# Patient Record
Sex: Female | Born: 2000 | Race: Asian | Hispanic: No | Smoking: Never smoker
Health system: Southern US, Community
[De-identification: ages and names within clinical notes are randomized; demographics above are authoritative.]

## PROBLEM LIST (undated history)

## (undated) ENCOUNTER — Inpatient Hospital Stay: Payer: Self-pay

## (undated) DIAGNOSIS — J45909 Unspecified asthma, uncomplicated: Secondary | ICD-10-CM

## (undated) DIAGNOSIS — T7422XA Child sexual abuse, confirmed, initial encounter: Secondary | ICD-10-CM

## (undated) DIAGNOSIS — M419 Scoliosis, unspecified: Secondary | ICD-10-CM

## (undated) DIAGNOSIS — Z8619 Personal history of other infectious and parasitic diseases: Secondary | ICD-10-CM

## (undated) DIAGNOSIS — F339 Major depressive disorder, recurrent, unspecified: Secondary | ICD-10-CM

## (undated) DIAGNOSIS — F4310 Post-traumatic stress disorder, unspecified: Secondary | ICD-10-CM

## (undated) HISTORY — PX: OTHER SURGICAL HISTORY: SHX169

## (undated) HISTORY — DX: Unspecified asthma, uncomplicated: J45.909

## (undated) HISTORY — DX: Child sexual abuse, confirmed, initial encounter: T74.22XA

## (undated) HISTORY — DX: Personal history of other infectious and parasitic diseases: Z86.19

## (undated) HISTORY — DX: Scoliosis, unspecified: M41.9

## (undated) HISTORY — PX: NO PAST SURGERIES: SHX2092

## (undated) HISTORY — DX: Post-traumatic stress disorder, unspecified: F43.10

## (undated) HISTORY — DX: Major depressive disorder, recurrent, unspecified: F33.9

---
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
## 2020-01-11 NOTE — Progress Notes (Deleted)
Pt present f/u for anxiety and depression. PHQ-9=    GAD-7=

## 2020-01-12 ENCOUNTER — Encounter: Payer: Medicaid Other | Admitting: Obstetrics and Gynecology

## 2020-03-01 ENCOUNTER — Encounter: Payer: Medicaid Other | Admitting: Obstetrics and Gynecology

## 2020-05-18 ENCOUNTER — Encounter: Payer: Medicaid Other | Admitting: Obstetrics and Gynecology

## 2020-08-08 ENCOUNTER — Ambulatory Visit: Payer: Self-pay

## 2020-12-28 DIAGNOSIS — Z202 Contact with and (suspected) exposure to infections with a predominantly sexual mode of transmission: Secondary | ICD-10-CM | POA: Diagnosis not present

## 2020-12-28 DIAGNOSIS — Z3202 Encounter for pregnancy test, result negative: Secondary | ICD-10-CM | POA: Diagnosis not present

## 2020-12-28 DIAGNOSIS — N898 Other specified noninflammatory disorders of vagina: Secondary | ICD-10-CM | POA: Diagnosis not present

## 2021-03-13 DIAGNOSIS — N898 Other specified noninflammatory disorders of vagina: Secondary | ICD-10-CM | POA: Diagnosis not present

## 2021-03-13 DIAGNOSIS — Z113 Encounter for screening for infections with a predominantly sexual mode of transmission: Secondary | ICD-10-CM | POA: Diagnosis not present

## 2021-04-21 DIAGNOSIS — O209 Hemorrhage in early pregnancy, unspecified: Secondary | ICD-10-CM | POA: Diagnosis not present

## 2021-04-21 DIAGNOSIS — O039 Complete or unspecified spontaneous abortion without complication: Secondary | ICD-10-CM | POA: Diagnosis not present

## 2021-04-21 DIAGNOSIS — Z3A00 Weeks of gestation of pregnancy not specified: Secondary | ICD-10-CM | POA: Diagnosis not present

## 2021-04-21 DIAGNOSIS — N898 Other specified noninflammatory disorders of vagina: Secondary | ICD-10-CM | POA: Diagnosis not present

## 2021-04-21 DIAGNOSIS — R102 Pelvic and perineal pain: Secondary | ICD-10-CM | POA: Diagnosis not present

## 2021-04-24 ENCOUNTER — Telehealth: Payer: Self-pay

## 2021-04-24 NOTE — Telephone Encounter (Signed)
Transition Care Management Follow-up Telephone Call  Date of discharge and from where: 04/21/2021 from [facility]  How have you been since you were released from the hospital? Pt stated that she is feeling well.   Any questions or concerns? No  Items Reviewed:  Did the pt receive and understand the discharge instructions provided? Yes   Medications obtained and verified? Yes   Other? No   Any new allergies since your discharge? No   Dietary orders reviewed? n/a  Do you have support at home? Yes   Functional Questionnaire: (I = Independent and D = Dependent) ADLs: I  Bathing/Dressing- I  Meal Prep- I  Eating- I  Maintaining continence- I  Transferring/Ambulation- I  Managing Meds- I   Follow up appointments reviewed:   PCP Hospital f/u appt confirmed? No    Specialist Hospital f/u appt confirmed? No    Are transportation arrangements needed? No   If their condition worsens, is the pt aware to call PCP or go to the Emergency Dept.? Yes  Was the patient provided with contact information for the PCP's office or ED? Yes  Was to pt encouraged to call back with questions or concerns? Yes

## 2021-05-11 DIAGNOSIS — Z87898 Personal history of other specified conditions: Secondary | ICD-10-CM | POA: Diagnosis not present

## 2021-05-11 DIAGNOSIS — O039 Complete or unspecified spontaneous abortion without complication: Secondary | ICD-10-CM | POA: Diagnosis not present

## 2021-05-11 DIAGNOSIS — N926 Irregular menstruation, unspecified: Secondary | ICD-10-CM | POA: Diagnosis not present

## 2021-05-11 DIAGNOSIS — F331 Major depressive disorder, recurrent, moderate: Secondary | ICD-10-CM | POA: Diagnosis not present

## 2021-06-14 DIAGNOSIS — N760 Acute vaginitis: Secondary | ICD-10-CM | POA: Diagnosis not present

## 2021-06-14 DIAGNOSIS — Z113 Encounter for screening for infections with a predominantly sexual mode of transmission: Secondary | ICD-10-CM | POA: Diagnosis not present

## 2021-09-05 DIAGNOSIS — Z3202 Encounter for pregnancy test, result negative: Secondary | ICD-10-CM | POA: Diagnosis not present

## 2021-09-05 DIAGNOSIS — Z113 Encounter for screening for infections with a predominantly sexual mode of transmission: Secondary | ICD-10-CM | POA: Diagnosis not present

## 2021-09-05 DIAGNOSIS — R109 Unspecified abdominal pain: Secondary | ICD-10-CM | POA: Diagnosis not present

## 2021-10-27 DIAGNOSIS — Z113 Encounter for screening for infections with a predominantly sexual mode of transmission: Secondary | ICD-10-CM | POA: Diagnosis not present

## 2021-10-27 DIAGNOSIS — N760 Acute vaginitis: Secondary | ICD-10-CM | POA: Diagnosis not present

## 2022-01-11 DIAGNOSIS — N910 Primary amenorrhea: Secondary | ICD-10-CM | POA: Diagnosis not present

## 2022-01-11 DIAGNOSIS — N912 Amenorrhea, unspecified: Secondary | ICD-10-CM | POA: Diagnosis not present

## 2022-05-23 DIAGNOSIS — R3915 Urgency of urination: Secondary | ICD-10-CM | POA: Diagnosis not present

## 2022-05-23 DIAGNOSIS — N390 Urinary tract infection, site not specified: Secondary | ICD-10-CM | POA: Diagnosis not present

## 2022-08-15 DIAGNOSIS — Z113 Encounter for screening for infections with a predominantly sexual mode of transmission: Secondary | ICD-10-CM | POA: Diagnosis not present

## 2022-10-01 ENCOUNTER — Encounter: Payer: Self-pay | Admitting: Obstetrics and Gynecology

## 2022-10-26 DIAGNOSIS — Z3201 Encounter for pregnancy test, result positive: Secondary | ICD-10-CM | POA: Diagnosis not present

## 2022-10-26 DIAGNOSIS — N912 Amenorrhea, unspecified: Secondary | ICD-10-CM | POA: Diagnosis not present

## 2022-10-28 DIAGNOSIS — J069 Acute upper respiratory infection, unspecified: Secondary | ICD-10-CM | POA: Diagnosis not present

## 2022-10-28 DIAGNOSIS — R070 Pain in throat: Secondary | ICD-10-CM | POA: Diagnosis not present

## 2022-10-28 DIAGNOSIS — Z20822 Contact with and (suspected) exposure to covid-19: Secondary | ICD-10-CM | POA: Diagnosis not present

## 2022-10-31 DIAGNOSIS — O99210 Obesity complicating pregnancy, unspecified trimester: Secondary | ICD-10-CM | POA: Diagnosis present

## 2022-10-31 DIAGNOSIS — Z8759 Personal history of other complications of pregnancy, childbirth and the puerperium: Secondary | ICD-10-CM | POA: Insufficient documentation

## 2022-11-22 DIAGNOSIS — Z8659 Personal history of other mental and behavioral disorders: Secondary | ICD-10-CM | POA: Diagnosis not present

## 2022-11-22 DIAGNOSIS — O99210 Obesity complicating pregnancy, unspecified trimester: Secondary | ICD-10-CM | POA: Diagnosis not present

## 2022-11-22 DIAGNOSIS — O0991 Supervision of high risk pregnancy, unspecified, first trimester: Secondary | ICD-10-CM | POA: Diagnosis not present

## 2022-11-22 DIAGNOSIS — Z113 Encounter for screening for infections with a predominantly sexual mode of transmission: Secondary | ICD-10-CM | POA: Diagnosis not present

## 2022-11-22 DIAGNOSIS — Z124 Encounter for screening for malignant neoplasm of cervix: Secondary | ICD-10-CM | POA: Diagnosis not present

## 2022-11-22 DIAGNOSIS — O99211 Obesity complicating pregnancy, first trimester: Secondary | ICD-10-CM | POA: Diagnosis not present

## 2022-11-22 DIAGNOSIS — Z8759 Personal history of other complications of pregnancy, childbirth and the puerperium: Secondary | ICD-10-CM | POA: Diagnosis not present

## 2022-11-22 DIAGNOSIS — F32A Depression, unspecified: Secondary | ICD-10-CM | POA: Diagnosis not present

## 2022-11-22 DIAGNOSIS — O99340 Other mental disorders complicating pregnancy, unspecified trimester: Secondary | ICD-10-CM | POA: Diagnosis not present

## 2022-11-22 DIAGNOSIS — O26891 Other specified pregnancy related conditions, first trimester: Secondary | ICD-10-CM | POA: Diagnosis not present

## 2022-11-22 DIAGNOSIS — O219 Vomiting of pregnancy, unspecified: Secondary | ICD-10-CM | POA: Diagnosis not present

## 2022-11-22 DIAGNOSIS — O99891 Other specified diseases and conditions complicating pregnancy: Secondary | ICD-10-CM | POA: Diagnosis not present

## 2022-11-22 DIAGNOSIS — N898 Other specified noninflammatory disorders of vagina: Secondary | ICD-10-CM | POA: Diagnosis not present

## 2022-11-22 LAB — OB RESULTS CONSOLE RUBELLA ANTIBODY, IGM: Rubella: IMMUNE

## 2022-11-22 LAB — OB RESULTS CONSOLE HEPATITIS B SURFACE ANTIGEN: Hepatitis B Surface Ag: NEGATIVE

## 2022-11-22 LAB — OB RESULTS CONSOLE GC/CHLAMYDIA
Chlamydia: NEGATIVE
Neisseria Gonorrhea: NEGATIVE

## 2022-11-22 LAB — OB RESULTS CONSOLE VARICELLA ZOSTER ANTIBODY, IGG: Varicella: IMMUNE

## 2022-11-28 ENCOUNTER — Telehealth: Payer: Self-pay | Admitting: Licensed Clinical Social Worker

## 2022-11-28 DIAGNOSIS — Z331 Pregnant state, incidental: Secondary | ICD-10-CM | POA: Diagnosis not present

## 2022-11-28 DIAGNOSIS — J209 Acute bronchitis, unspecified: Secondary | ICD-10-CM | POA: Diagnosis not present

## 2022-11-28 DIAGNOSIS — J069 Acute upper respiratory infection, unspecified: Secondary | ICD-10-CM | POA: Diagnosis not present

## 2022-11-28 NOTE — Telephone Encounter (Signed)
-----   Message from Ellison Carwin sent at 11/27/2022  9:11 AM EST ----- Regarding: MH referral Morning Ma'am. Hope all is well.  May I refer this member to you on behalf of Cassandra?  She stated that she worked with you before with her 21 year old son and is open to receiving counseling services with you again.  She did report currently taking Lexapro, in which her doctor prescribed her about 1 week ago.  Thanks

## 2022-12-21 DIAGNOSIS — O99210 Obesity complicating pregnancy, unspecified trimester: Secondary | ICD-10-CM | POA: Diagnosis not present

## 2022-12-24 NOTE — L&D Delivery Note (Signed)
Delivery Note  DARNISE MONTAG is a [id number] at [redacted]w[redacted]d, Patient's last menstrual period was 08/22/2022 (exact date)., inconsistent with [place] at [redacted]w[redacted]d. Estimated Date of Delivery: 06/18/23   First Stage: Labor onset: 0200 Induction: misoprostol Analgesia Eliezer Lofts intrapartum: Epidural SROM at 0405 GBS: PCN IP Antibiotics: Vancomycin x 1 dose - stopped d/t potential reaction  Second Stage: Complete dilation at 0715 Onset of pushing at 0753 FHR second stage 130 bpm with moderate, variable decels with pushing   Khari presented to L&D with elevated blood pressures. Misoprostol was used for cervical ripening. She started to labor after SROM. She progressed well to C/C/-1 with a strong urge to push.  She pushed effectively over approximately 50 minutes for a spontaneous vaginal birth.  Delivery of a viable baby boy on 05/31/2023 at 0841 by CNM Delivery of fetal head in OA position with restitution to ROT. No nuchal cord;  Anterior then posterior shoulders delivered easily with gentle downward traction. Baby placed on mom's chest, and attended to by baby RN Cord double clamped after cessation of pulsation, cut by father of baby  Cord blood sample collection: Yes O POS  Third Stage: Oxytocin bolus started after delivery of infant for hemorrhage prophylaxis  Placenta delivered via Tomasa Blase mechanism intact with 3 VC @ 0846 Placenta disposition: discarded Uterine tone firm / bleeding minimal  No laceration identified  Anesthesia for repair: N/A Repair not indicated Est. Blood Loss (mL): 50 ml  Complications: none  Mom to postpartum.  Baby to Couplet care / Skin to Skin.  Newborn: Information for the patient's newborn:  Ericha, Whittingham [[id number]]  Live born female  Birth Weight: 6 lb 13 oz (3090 g) APGAR: 6, 9  Newborn Delivery   Birth date/time: 05/31/2023 08:41:00 Delivery type: Vaginal, Spontaneous     Feeding planned: breast feeding and formula  feeding  ---------- Margaretmary Eddy, CNM Certified Nurse Midwife [facility]

## 2022-12-25 ENCOUNTER — Telehealth: Payer: Self-pay | Admitting: Licensed Clinical Social Worker

## 2022-12-25 NOTE — Telephone Encounter (Signed)
-----   Message from Dorthy Cooler, RN sent at 12/20/2022  9:40 AM EST ----- Good Morning Estill Bamberg. I hope you had a merry Christmas. I wanted to let you know that I talked to [facility] today and she wanted me to let you know that she did come to sign consents however no one was at the information booth and no one was around. She states that she will return on her next day off.   Cassandra

## 2023-02-21 ENCOUNTER — Telehealth: Payer: Self-pay | Admitting: Licensed Clinical Social Worker

## 2023-02-21 NOTE — Telephone Encounter (Signed)
-----   Message from Dorthy Cooler, RN sent at 02/21/2023 10:57 AM EST ----- Kandis Nab would you reach back out to this patient? Per her EPIC visit note with provider she stated she has not been successful in contacting you and she is wanting counseling

## 2023-02-25 ENCOUNTER — Ambulatory Visit: Payer: Medicaid Other | Admitting: Licensed Clinical Social Worker

## 2023-02-25 DIAGNOSIS — F411 Generalized anxiety disorder: Secondary | ICD-10-CM | POA: Insufficient documentation

## 2023-02-25 DIAGNOSIS — F331 Major depressive disorder, recurrent, moderate: Secondary | ICD-10-CM
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
## 2023-03-11 ENCOUNTER — Ambulatory Visit: Payer: Medicaid Other | Admitting: Licensed Clinical Social Worker

## 2023-03-11 DIAGNOSIS — F411 Generalized anxiety disorder: Secondary | ICD-10-CM

## 2023-03-11 DIAGNOSIS — F331 Major depressive disorder, recurrent, moderate: Secondary | ICD-10-CM

## 2023-03-11 NOTE — Progress Notes (Signed)
Counselor/Therapist Progress Note  Patient ID: PALMIRA HIRSCHI, MRN: [id number],    Date: 03/11/2023  Time Spent: 52 minutes    Treatment Type: Psychotherapy  Reported Symptoms: Obsessive thinking and anxiety, anxious thoughts, overwhelm, irritability, sometimes sadness   Mental Status Exam:  Appearance:   Casual and Well Groomed     Behavior:  Appropriate, Sharing, and Motivated  Motor:  Normal  Speech/Language:   Clear and Coherent and Normal Rate  Affect:  Appropriate, Congruent, and Full Range  Mood:  normal  Thought process:  normal  Thought content:    WNL  Sensory/Perceptual disturbances:    WNL  Orientation:  oriented to person, place, time/date, situation, and day of week  Attention:  Good  Concentration:  Good  Memory:  WNL  Fund of knowledge:   Good  Insight:    Fair  Judgment:   Fair  Impulse Control:  Fair   Risk Assessment: Danger to Self:  No Self-injurious Behavior: No Danger to Others: No Duty to Warn:no Physical Aggression / Violence:No  Access to Firearms a concern: No  Gang Involvement:No   Subjective: Patient was engaged and cooperative throughout the session using time effectively to discuss thoughts, feelings, goal of treatment and treatment plan. Patient voices continued motivation for treatment and understanding of CBTs. Patient is likely to benefit from future treatment in combination of medication management.    Interventions: Cognitive Behavioral Therapy and client centered  Checked in with patient and reviewed previous session, including assessment and goal of treatment. Taught patient about CBTs. Explored patient's goal of treatment to increase understanding of patient's goals and  worked collaboratively to develop CBTs treatment plan. Provided support through active listening, validation of feelings, and highlighted patient's strengths.  Diagnosis:   ICD-10-CM   1. Major depressive disorder, recurrent episode, moderate (HCC)  F33.1      2. Generalized anxiety disorder  F41.1      Plan: Patient's goal of treatment is to hopefully to come out and have a different outlook and not be in a hamster ball and would like to look at things with the glass half-full versus half-empty and to also learn coping skills that work. Patient voices she would also like to do some trauma work / healing   Treatment Target: Understand the relationship between thoughts, emotions, and behaviors  Psychoeducation on CBTs model   Oriented the client to the therapeutic approach Teach the connection between thoughts, emotions, and behaviors   Treatment Target: Increase realistic balanced thinking -to learn how to replace thinking with thoughts that are more accurate or helpful Explore patient's thoughts, beliefs, automatic thoughts, assumptions  Identify and replace unhelpful thinking patterns (upsetting ideas, self-talk and mental images) Process trauma and distress and allow for emotional release and identification of "stuck points'  Questioning and challenging thoughts Cognitive reappraisal  Restructuring, Socratic questioning  Teach patient about core beliefs   Treatment Target: Increase sense of self - "who am I"  / Creating a life worth living  Values clarification  Self-care - nutrition, sleep, exercise  Mindfulness skills Emotional regulation skills  Interpersonal Effectiveness  Distress Tolerance Assist patient in developing goal and value directions   Future Appointments  Date Time Provider [place]  03/25/2023  9:00 AM Milton Ferguson, LCSW [facility] None     Milton Ferguson, LCSW

## 2023-03-25 ENCOUNTER — Ambulatory Visit: Payer: Medicaid Other | Admitting: Licensed Clinical Social Worker

## 2023-03-25 DIAGNOSIS — F411 Generalized anxiety disorder: Secondary | ICD-10-CM

## 2023-03-25 DIAGNOSIS — F331 Major depressive disorder, recurrent, moderate: Secondary | ICD-10-CM

## 2023-03-25 NOTE — Progress Notes (Signed)
Counselor/Therapist Progress Note  Patient ID: Ruth Robinson, MRN: [id number],    Date: 03/25/2023  Time Spent: 48 minutes    Treatment Type: Psychotherapy  Reported Symptoms:  "overall pretty good"  Mental Status Exam:  Appearance:   Neat and Well Groomed     Behavior:  Appropriate, Sharing, and Motivated  Motor:  Normal  Speech/Language:   Clear and Coherent and Normal Rate  Affect:  Appropriate, Congruent, and Full Range  Mood:  normal  Thought process:  normal  Thought content:    WNL  Sensory/Perceptual disturbances:    WNL  Orientation:  oriented to person, place, time/date, situation, and day of week  Attention:  Good  Concentration:  Good  Memory:  WNL  Fund of knowledge:   Good  Insight:    Fair  Judgment:   Fair  Impulse Control:  Fair   Risk Assessment: Danger to Self:  No Self-injurious Behavior: No Danger to Others: No Duty to Warn:no Physical Aggression / Violence:No  Access to Firearms a concern: No  Gang Involvement:No   Subjective: Patient was engaged and cooperative throughout the session using time effectively to discuss thoughts and feelings. Patient voices overall mood being good. Patient voices continued motivation for treatment and understanding of depression and anxiety related to environmental stressors. Patient is likely to benefit from future treatment because she remains motivated to decrease symptoms and improve functioning and reports benefit from sessions.   Interventions: Cognitive Behavioral Therapy and client centered  Established psychological safety. Checked in with patient regarding their week. LCSW processed with the patient how they have been doing since the last follow-up session. LCSW assisted patient in processing their thoughts and emotions regarding stress with family/living situation and unresolved trauma from childhood. LCSW taught patient about radical acceptance, discussed daily affirmations. Provided support through  active listening, validation of feelings, and highlighted patient's strengths.  Diagnosis:   ICD-10-CM   1. Major depressive disorder, recurrent episode, moderate  F33.1     2. Generalized anxiety disorder  F41.1      Plan: Patient's goal of treatment is to hopefully to come out and have a different outlook and not be in a hamster ball and would like to look at things with the glass half-full versus half-empty and to also learn coping skills that work. Patient voices she would also like to do some trauma work / healing    Treatment Target: Understand the relationship between thoughts, emotions, and behaviors  Psychoeducation on CBTs model   Oriented the client to the therapeutic approach Teach the connection between thoughts, emotions, and behaviors    Treatment Target: Increase realistic balanced thinking -to learn how to replace thinking with thoughts that are more accurate or helpful Explore patient's thoughts, beliefs, automatic thoughts, assumptions  Identify and replace unhelpful thinking patterns (upsetting ideas, self-talk and mental images) Process trauma and distress and allow for emotional release and identification of "stuck points'  Questioning and challenging thoughts Cognitive reappraisal  Restructuring, Socratic questioning  Teach patient about core beliefs    Treatment Target: Increase sense of self - "who am I"  / Creating a life worth living  Values clarification  Self-care - nutrition, sleep, exercise  Mindfulness skills Emotional regulation skills  Interpersonal Effectiveness  Distress Tolerance Assist patient in developing goal and value directions   Future Appointments  Date Time Provider [place]  04/01/2023  9:00 AM Milton Ferguson, LCSW [facility] None     Milton Ferguson, LCSW

## 2023-04-01 ENCOUNTER — Ambulatory Visit: Payer: Medicaid Other | Admitting: Licensed Clinical Social Worker

## 2023-04-08 ENCOUNTER — Ambulatory Visit: Payer: Medicaid Other | Admitting: Licensed Clinical Social Worker

## 2023-04-08 DIAGNOSIS — F411 Generalized anxiety disorder: Secondary | ICD-10-CM

## 2023-04-08 DIAGNOSIS — F331 Major depressive disorder, recurrent, moderate: Secondary | ICD-10-CM

## 2023-04-08 NOTE — Progress Notes (Signed)
Counselor/Therapist Progress Note  Patient ID: Ruth Robinson, MRN: [id number],    Date: 04/08/2023  Time Spent: 45 minutes    Treatment Type: Psychotherapy  Reported Symptoms:  Continued anxiety and depressed mood  Mental Status Exam:  Appearance:   Casual, Neat, and Well Groomed     Behavior:  Appropriate, Sharing, and Motivated  Motor:  Normal  Speech/Language:   Normal Rate  Affect:  Appropriate, Congruent, and Full Range  Mood:  normal  Thought process:  normal  Thought content:    WNL  Sensory/Perceptual disturbances:    WNL  Orientation:  oriented to person, place, time/date, situation, and day of week  Attention:  Good  Concentration:  Good  Memory:  WNL  Fund of knowledge:   Good  Insight:    Fair  Judgment:   Fair  Impulse Control:  Fair   Risk Assessment: Danger to Self:  No Self-injurious Behavior: No Danger to Others: No Duty to Warn:no Physical Aggression / Violence:No  Access to Firearms a concern: No  Gang Involvement:No   Subjective: Patient was receptive to feedback and intervention from LCSW and actively and effectively participated throughout the session. Patient reports continued stress and mood and anxiety symptoms - Increase dose of Lexapro to 20mg . Patient is likely to benefit from future treatment because she remains motivated to decrease symptoms and improve functioning.    Interventions: Cognitive Behavioral Therapy and Client Centered  Established psychological safety. Checked in with client about how she has been since last session.  Engaged patient in processing current psychosocial stressors related to relationship challenges with boyfriend. LCSW discussed unhelpful thoughts and core beliefs and reviewed communication strategies. Encouraged patient to increase self-care. Provided support through active listening, validation of feelings, and highlighted patient's strengths.   Diagnosis:   ICD-10-CM   1. Major depressive disorder,  recurrent episode, moderate  F33.1     2. Generalized anxiety disorder  F41.1       Plan: Patient's goal of treatment is to hopefully to come out and have a different outlook and not be in a hamster ball and would like to look at things with the glass half-full versus half-empty and to also learn coping skills that work. Patient voices she would also like to do some trauma work / healing    Treatment Target: Understand the relationship between thoughts, emotions, and behaviors  Psychoeducation on CBTs model   Oriented the client to the therapeutic approach Teach the connection between thoughts, emotions, and behaviors    Treatment Target: Increase realistic balanced thinking -to learn how to replace thinking with thoughts that are more accurate or helpful Explore patient's thoughts, beliefs, automatic thoughts, assumptions  Identify and replace unhelpful thinking patterns (upsetting ideas, self-talk and mental images) Process trauma and distress and allow for emotional release and identification of "stuck points'  Questioning and challenging thoughts Cognitive reappraisal  Restructuring, Socratic questioning  Teach patient about core beliefs    Treatment Target: Increase sense of self - "who am I"  / Creating a life worth living  Values clarification  Self-care - nutrition, sleep, exercise  Mindfulness skills Emotional regulation skills  Interpersonal Effectiveness  Distress Tolerance Assist patient in developing goal and value directions   Future Appointments  Date Time Provider Department Center  04/22/2023  9:00 AM Kathreen Cosier, LCSW [facility] None     Kathreen Cosier, LCSW

## 2023-04-22 ENCOUNTER — Ambulatory Visit: Payer: Medicaid Other | Admitting: Licensed Clinical Social Worker

## 2023-04-22 DIAGNOSIS — F411 Generalized anxiety disorder: Secondary | ICD-10-CM

## 2023-04-22 DIAGNOSIS — F331 Major depressive disorder, recurrent, moderate: Secondary | ICD-10-CM

## 2023-04-22 NOTE — Progress Notes (Signed)
Counselor/Therapist Progress Note  Patient ID: Ruth Robinson, MRN: [id number],    Date: 04/22/2023  Time Spent: 45 minutes    Treatment Type: Psychotherapy  Reported Symptoms:  mild depressive symptoms, anxiety, anxious thoughts, irritable   Mental Status Exam:  Appearance:   Casual     Behavior:  Appropriate, Sharing, Motivated, and Assertive  Motor:  Normal  Speech/Language:   Clear and Coherent and Normal Rate  Affect:  Appropriate, Congruent, and Full Range  Mood:  normal  Thought process:  normal  Thought content:    WNL  Sensory/Perceptual disturbances:    WNL  Orientation:  oriented to person, place, time/date, situation, and day of week  Attention:  Good  Concentration:  Good  Memory:  WNL  Fund of knowledge:   Good  Insight:    Fair  Judgment:   Fair  Impulse Control:  Fair   Risk Assessment: Danger to Self:  No Self-injurious Behavior: No Danger to Others: No Duty to Warn:no Physical Aggression / Violence:No  Access to Firearms a concern: No  Gang Involvement:No   Subjective: Patient was receptive to feedback and intervention from LCSW and actively and effectively participated throughout the session. Patient reports some benefit from current medication in combination with regular sessions. She reports mild symptoms of depression and anxiety due to multiple stressors.   Interventions: Cognitive Behavioral Therapy and Client Centered  Established psychological safety. Checked in with patient regarding their week. LCSW processed with the patient how they have been doing since the last follow-up session, ongoing stressors in relationship with boyfriend/father of the baby. LCSW reviewed circles of control, and acceptance. LCSW assisted patient in processing their thoughts and emotions regarding upcomming delivery and past delivery experience.LCSW reviewed mindfulness approaches to staying in the moment and "what if's". Provided support through active listening,  validation of feelings, and highlighted patient's strengths.    Diagnosis:   ICD-10-CM   1. Major depressive disorder, recurrent episode, moderate (HCC)  F33.1     2. Generalized anxiety disorder  F41.1      Plan:  Patient's goal of treatment is to hopefully to come out and have a different outlook and not be in a hamster ball and would like to look at things with the glass half-full versus half-empty and to also learn coping skills that work. Patient voices she would also like to do some trauma work / healing    Treatment Target: Understand the relationship between thoughts, emotions, and behaviors  Psychoeducation on CBTs model   Oriented the client to the therapeutic approach Teach the connection between thoughts, emotions, and behaviors    Treatment Target: Increase realistic balanced thinking -to learn how to replace thinking with thoughts that are more accurate or helpful Explore patient's thoughts, beliefs, automatic thoughts, assumptions  Identify and replace unhelpful thinking patterns (upsetting ideas, self-talk and mental images) Process trauma and distress and allow for emotional release and identification of "stuck points'  Questioning and challenging thoughts Cognitive reappraisal  Restructuring, Socratic questioning  Teach patient about core beliefs    Treatment Target: Increase sense of self - "who am I"  / Creating a life worth living  Values clarification  Self-care - nutrition, sleep, exercise  Mindfulness skills Emotional regulation skills  Interpersonal Effectiveness  Distress Tolerance Assist patient in developing goal and value directions   Future Appointments  Date Time Provider Department Center  05/06/2023  9:00 AM Kathreen Cosier, LCSW [facility] None     Kathreen Cosier, LCSW

## 2023-05-06 ENCOUNTER — Other Ambulatory Visit: Payer: Self-pay

## 2023-05-06 ENCOUNTER — Ambulatory Visit: Payer: Medicaid Other | Admitting: Licensed Clinical Social Worker

## 2023-05-06 ENCOUNTER — Observation Stay
Admission: EM | Admit: 2023-05-06 | Discharge: 2023-05-06 | Disposition: A | Discharge status: Home / Self Care | Payer: Medicaid Other | Attending: Obstetrics and Gynecology | Admitting: Obstetrics and Gynecology

## 2023-05-06 ENCOUNTER — Encounter: Payer: Self-pay | Admitting: Obstetrics and Gynecology

## 2023-05-06 DIAGNOSIS — O99213 Obesity complicating pregnancy, third trimester: Secondary | ICD-10-CM | POA: Diagnosis not present

## 2023-05-06 DIAGNOSIS — E669 Obesity, unspecified: Secondary | ICD-10-CM | POA: Diagnosis not present

## 2023-05-06 DIAGNOSIS — Z3A33 33 weeks gestation of pregnancy: Secondary | ICD-10-CM | POA: Diagnosis not present

## 2023-05-06 DIAGNOSIS — O4193X0 Disorder of amniotic fluid and membranes, unspecified, third trimester, not applicable or unspecified: Secondary | ICD-10-CM | POA: Diagnosis present

## 2023-05-06 DIAGNOSIS — O99013 Anemia complicating pregnancy, third trimester: Secondary | ICD-10-CM | POA: Insufficient documentation

## 2023-05-06 DIAGNOSIS — Z3490 Encounter for supervision of normal pregnancy, unspecified, unspecified trimester: Secondary | ICD-10-CM

## 2023-05-06 DIAGNOSIS — O4290 Premature rupture of membranes, unspecified as to length of time between rupture and onset of labor, unspecified weeks of gestation: Secondary | ICD-10-CM | POA: Diagnosis present

## 2023-05-06 LAB — ROM PLUS (ARMC ONLY): Rom Plus: NEGATIVE

## 2023-05-06 LAB — WET PREP, GENITAL
Clue Cells Wet Prep HPF POC: NONE SEEN
Sperm: NONE SEEN
Trich, Wet Prep: NONE SEEN
WBC, Wet Prep HPF POC: <10 (ref <10)
Yeast Wet Prep HPF POC: NONE SEEN

## 2023-05-06 NOTE — OB Triage Note (Signed)
Discharge instructions, labor precautions, and follow-up care reviewed with patient and mother of patient. All questions answered. Patient verbalized understanding. Discharged ambulatory off unit.   

## 2023-05-06 NOTE — Discharge Summary (Signed)
Patient ID: Ruth Robinson MRN: [id number] DOB/AGE: 2001/02/26 22 y.o.  Admit date: 05/06/2023 Discharge date: 05/06/2023  Admission Diagnoses: 22yo at G3P1 at [redacted]w[redacted]d presents with leaking fluid for the past 3 weeks.   Discharge Diagnoses: Normal discomforts of pregnancy   Factors complicating pregnancy: History of preeclampsia  Obesity in pregnancy  Depression History of PPD Anemia   Prenatal Procedures: NST  Consults: None  Significant Diagnostic Studies:  Results for orders placed or performed during the hospital encounter of 05/06/23 (from the past 168 hour(s))  Wet prep, genital   Collection Time: 05/06/23  9:07 AM  Result Value Ref Range   Yeast Wet Prep HPF POC NONE SEEN NONE SEEN   Trich, Wet Prep NONE SEEN NONE SEEN   Clue Cells Wet Prep HPF POC NONE SEEN NONE SEEN   WBC, Wet Prep HPF POC <10 <10   Sperm NONE SEEN   ROM Plus ([facility] only)   Collection Time: 05/06/23  9:07 AM  Result Value Ref Range   Rom Plus NEGATIVE     Treatments: none  Hospital Course:  This is a 22 y.o. [id number] with IUP at [redacted]w[redacted]d seen for leaking fluid.  No bleeding.  She was observed, fetal heart rate monitoring remained reassuring, and she had no signs/symptoms of labor or other maternal-fetal concerns.  Labs were negative. She was deemed stable for discharge to home with outpatient follow up.  Discharge Physical Exam:  BP 123/72 (BP Location: Right Arm)   Pulse 92   Temp 98.1 F (36.7 C) (Oral)   Resp 18   Ht 5\' 1"  (1.549 m)   Wt 86.2 kg   BMI 35.90 kg/m   General: NAD CV: RRR Pulm: nl effort ABD: s/nd/nt, gravid DVT Evaluation: LE non-ttp, no evidence of DVT on exam.  NST: FHR baseline: 135 bpm Variability: moderate Accelerations: yes Decelerations: none Category/reactivity: reactive  TOCO: quiet, UI SVE: deferred      Discharge Condition: Stable  Disposition: Discharge disposition: 01-Home or Self Care        Allergies as of 05/06/2023       Reactions    Amoxicillin-pot Clavulanate Hives   (Augmentin)        Medication List     STOP taking these medications    buPROPion 300 MG 24 hr tablet Commonly known as: Wellbutrin XL   etonogestrel-ethinyl estradiol 0.12-0.015 MG/24HR vaginal ring Commonly known as: NUVARING   ibuprofen 800 MG tablet Commonly known as: ADVIL       TAKE these medications    aspirin 81 MG chewable tablet Chew 81 mg by mouth daily.   escitalopram 20 MG tablet Commonly known as: LEXAPRO Take 20 mg by mouth daily.   ProAir HFA 108 (90 Base) MCG/ACT inhaler Generic drug: albuterol INHALE 2 PUFFS BY MOUTH EVERY 4 TO 6 HOURS AS NEEDED        Follow-up Information     [facility] Follow up.   Why: Keep all scheduled appointments Contact information: [address] [place] [address] [phone]                Signed:  Quillian Quince 05/06/2023 10:51 AM

## 2023-05-06 NOTE — OB Triage Note (Signed)
Patient is a [id number] at [redacted]w[redacted]d who presents to unit c/o clear leakage for about 3 weeks on and off and also reports sharp, sudden pain in her perineum. Reports +FM, denies vaginal bleeding. External monitors applied and assessing. Initial FHT 130. Vital signs WDL.

## 2023-05-06 NOTE — Progress Notes (Signed)
Patient does not have a legal guardian  

## 2023-05-16 LAB — OB RESULTS CONSOLE RPR: RPR: NONREACTIVE

## 2023-05-16 LAB — OB RESULTS CONSOLE GBS: GBS: POSITIVE

## 2023-05-16 LAB — OB RESULTS CONSOLE HIV ANTIBODY (ROUTINE TESTING): HIV: NONREACTIVE

## 2023-05-21 ENCOUNTER — Ambulatory Visit: Payer: Medicaid Other | Admitting: Licensed Clinical Social Worker

## 2023-05-21 DIAGNOSIS — F411 Generalized anxiety disorder: Secondary | ICD-10-CM

## 2023-05-21 DIAGNOSIS — B951 Streptococcus, group B, as the cause of diseases classified elsewhere: Secondary | ICD-10-CM | POA: Diagnosis present

## 2023-05-21 DIAGNOSIS — F331 Major depressive disorder, recurrent, moderate: Secondary | ICD-10-CM

## 2023-05-21 NOTE — Progress Notes (Signed)
Counselor/Therapist Progress Note  Patient ID: JEMINI REDMAN, MRN: [id number],    Date: 05/21/2023  Time Spent: 45 minutes    Treatment Type: Psychotherapy  Reported Symptoms:  Depressed mood, isolating / withdrawing; Anxiety, anxious thoughts   Mental Status Exam:  Appearance:   Casual     Behavior:  Appropriate and Sharing  Motor:  Normal  Speech/Language:   Clear and Coherent and Normal Rate  Affect:  Appropriate and Congruent  Mood:  normal  Thought process:  normal  Thought content:    WNL  Sensory/Perceptual disturbances:    WNL  Orientation:  oriented to person, place, time/date, and situation  Attention:  Good  Concentration:  Good  Memory:  WNL  Fund of knowledge:   Good  Insight:    Fair  Judgment:   Fair  Impulse Control:  Fair   Risk Assessment: Danger to Self:  No Self-injurious Behavior: No Danger to Others: No Duty to Warn:no Physical Aggression / Violence:No  Access to Firearms a concern: No  Gang Involvement:No   Subjective: Patient was receptive to feedback and intervention from LCSW and actively and effectively participated throughout the session. Patient reports she has been depressed due to relationship issues. Patient is likely to benefit from future treatment because she remains motivated to decrease depression and anxiety symptoms and reports benefit of regular sessions in addressing these symptoms.    Interventions: Cognitive Behavioral Therapy and client centered Established psychological safety. Checked in with patient regarding their week. Clinician met with patient to identify needs related to stressors and functioning, and assess and monitor for signs and symptoms of depression and anxiety, and assess safety. The clinician processed with the patient how they have been doing since the last follow-up session. LCSW assisted patient in processing their thoughts and emotions about what they have experienced in relationship with father of baby,  his family, and regarding current pregnancy. LCSW validated patient's feeling, taught patient about radical acceptance, began providing information on postpartum depression and reviewed the importance of support and self-care. Provided support through active listening, validation of feelings, and highlighted patient's strengths.  Diagnosis:   ICD-10-CM   1. Major depressive disorder, recurrent episode, moderate (HCC)  F33.1     2. Generalized anxiety disorder  F41.1       Plan:  Patient's goal of treatment is to hopefully to come out and have a different outlook and not be in a hamster ball and would like to look at things with the glass half-full versus half-empty and to also learn coping skills that work. Patient voices she would also like to do some trauma work / healing    Treatment Target: Understand the relationship between thoughts, emotions, and behaviors  Psychoeducation on CBTs model   Oriented the client to the therapeutic approach Teach the connection between thoughts, emotions, and behaviors    Treatment Target: Increase realistic balanced thinking -to learn how to replace thinking with thoughts that are more accurate or helpful Explore patient's thoughts, beliefs, automatic thoughts, assumptions  Identify and replace unhelpful thinking patterns (upsetting ideas, self-talk and mental images) Process trauma and distress and allow for emotional release and identification of "stuck points'  Questioning and challenging thoughts Cognitive reappraisal  Restructuring, Socratic questioning  Teach patient about core beliefs    Treatment Target: Increase sense of self - "who am I"  / Creating a life worth living  Values clarification  Self-care - nutrition, sleep, exercise  Mindfulness skills Emotional regulation skills  Interpersonal Effectiveness  Distress Tolerance Assist patient in developing goal and value directions   Future Appointments  Date Time Provider Department  Center  06/03/2023  9:00 AM Kathreen Cosier, LCSW [facility] None    Kathreen Cosier, [place]

## 2023-05-22 ENCOUNTER — Observation Stay
Admission: EM | Admit: 2023-05-22 | Discharge: 2023-05-22 | Disposition: A | Discharge status: Home / Self Care | Payer: Medicaid Other | Attending: Obstetrics and Gynecology | Admitting: Obstetrics and Gynecology

## 2023-05-22 ENCOUNTER — Other Ambulatory Visit: Payer: Self-pay

## 2023-05-22 ENCOUNTER — Encounter: Payer: Self-pay | Admitting: Obstetrics and Gynecology

## 2023-05-22 DIAGNOSIS — O4703 False labor before 37 completed weeks of gestation, third trimester: Principal | ICD-10-CM | POA: Insufficient documentation

## 2023-05-22 DIAGNOSIS — Z3A36 36 weeks gestation of pregnancy: Secondary | ICD-10-CM | POA: Diagnosis not present

## 2023-05-22 DIAGNOSIS — O26893 Other specified pregnancy related conditions, third trimester: Secondary | ICD-10-CM | POA: Diagnosis not present

## 2023-05-22 DIAGNOSIS — Z7982 Long term (current) use of aspirin: Secondary | ICD-10-CM | POA: Insufficient documentation

## 2023-05-22 DIAGNOSIS — O99513 Diseases of the respiratory system complicating pregnancy, third trimester: Secondary | ICD-10-CM | POA: Diagnosis not present

## 2023-05-22 DIAGNOSIS — Z79899 Other long term (current) drug therapy: Secondary | ICD-10-CM | POA: Diagnosis not present

## 2023-05-22 DIAGNOSIS — J45909 Unspecified asthma, uncomplicated: Secondary | ICD-10-CM | POA: Diagnosis not present

## 2023-05-22 DIAGNOSIS — R519 Headache, unspecified: Secondary | ICD-10-CM | POA: Diagnosis present

## 2023-05-22 LAB — PROTEIN / CREATININE RATIO, URINE
Creatinine, Urine: 19 mg/dL
Total Protein, Urine: <6 mg/dL

## 2023-05-22 LAB — COMPREHENSIVE METABOLIC PANEL
ALT: 10 U/L (ref 0–44)
AST: 13 U/L — ABNORMAL LOW (ref 15–41)
Albumin: 2.9 g/dL — ABNORMAL LOW (ref 3.5–5.0)
Alkaline Phosphatase: 110 U/L (ref 38–126)
Anion gap: 7 (ref 5–15)
BUN: <5 mg/dL — ABNORMAL LOW (ref 6–20)
CO2: 23 mmol/L (ref 22–32)
Calcium: 8.8 mg/dL — ABNORMAL LOW (ref 8.9–10.3)
Chloride: 106 mmol/L (ref 98–111)
Creatinine, Ser: 0.47 mg/dL (ref 0.44–1.00)
GFR, Estimated: >60 mL/min (ref >60)
Glucose, Bld: 78 mg/dL (ref 70–99)
Potassium: 3.9 mmol/L (ref 3.5–5.1)
Sodium: 136 mmol/L (ref 135–145)
Total Bilirubin: 0.3 mg/dL (ref 0.3–1.2)
Total Protein: 6.8 g/dL (ref 6.5–8.1)

## 2023-05-22 LAB — CBC WITH DIFFERENTIAL/PLATELET
Abs Immature Granulocytes: 0.04 10*3/uL (ref 0.00–0.07)
Basophils Absolute: 0 10*3/uL (ref 0.0–0.1)
Basophils Relative: 0 %
Eosinophils Absolute: 0.1 10*3/uL (ref 0.0–0.5)
Eosinophils Relative: 2 %
HCT: 31.9 % — ABNORMAL LOW (ref 36.0–46.0)
Hemoglobin: 10.4 g/dL — ABNORMAL LOW (ref 12.0–15.0)
Immature Granulocytes: 1 %
Lymphocytes Relative: 25 %
Lymphs Abs: 1.7 10*3/uL (ref 0.7–4.0)
MCH: 27.7 pg (ref 26.0–34.0)
MCHC: 32.6 g/dL (ref 30.0–36.0)
MCV: 84.8 fL (ref 80.0–100.0)
Monocytes Absolute: 0.6 10*3/uL (ref 0.1–1.0)
Monocytes Relative: 9 %
Neutro Abs: 4.4 10*3/uL (ref 1.7–7.7)
Neutrophils Relative %: 63 %
Platelets: 223 10*3/uL (ref 150–400)
RBC: 3.76 MIL/uL — ABNORMAL LOW (ref 3.87–5.11)
RDW: 13.5 % (ref 11.5–15.5)
WBC: 6.9 10*3/uL (ref 4.0–10.5)
nRBC: 0 % (ref 0.0–0.2)

## 2023-05-22 MED ORDER — ACETAMINOPHEN 325 MG PO TABS: 650.0000 mg | ORAL_TABLET | ORAL | Status: DC | PRN

## 2023-05-22 MED ORDER — CALCIUM CARBONATE ANTACID 500 MG PO CHEW: 2.0000 | CHEWABLE_TABLET | ORAL | Status: DC | PRN
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
## 2023-05-22 NOTE — OB Triage Note (Signed)
Pt is a 21yo G3P0111, 36w 1d. She arrived to the unit with complaints of pelvic pressure/lighting crotch that is coming every 10-15 minutes. She states that this has been on going since yesterday.   During assessment she states that she has been having right epigastric pain for the past week that sometimes shifts to the left side. She states that she has not had any visual disturbances. She states that she has had suffered with headaches for most of this pregnancy.   She denies vaginal bleeding, reports positive fetal movement. VS stable, monitors applied and assessing.   Initial FHT 130 at 0834.   CNM aware of pt arrival.

## 2023-05-22 NOTE — OB Triage Note (Signed)
Discharge instructions provided to pt. Pt verbalizes understanding. Vaginal bleeding and discharge, contractions, and fetal movement reviewed by RN. Red flag symptoms for PIH reviewed. Follow-up care reviewed, pt will be seen tomorrow for prenatal apt. Pt discharged home with mother.

## 2023-05-29 ENCOUNTER — Observation Stay
Admission: EM | Admit: 2023-05-29 | Discharge: 2023-05-29 | Disposition: A | Discharge status: Home / Self Care | Payer: Medicaid Other | Attending: Obstetrics and Gynecology | Admitting: Obstetrics and Gynecology

## 2023-05-29 ENCOUNTER — Other Ambulatory Visit: Payer: Self-pay

## 2023-05-29 ENCOUNTER — Encounter: Payer: Self-pay | Admitting: Obstetrics and Gynecology

## 2023-05-29 DIAGNOSIS — O99513 Diseases of the respiratory system complicating pregnancy, third trimester: Secondary | ICD-10-CM | POA: Diagnosis not present

## 2023-05-29 DIAGNOSIS — O479 False labor, unspecified: Secondary | ICD-10-CM | POA: Diagnosis present

## 2023-05-29 DIAGNOSIS — Z3A36 36 weeks gestation of pregnancy: Secondary | ICD-10-CM | POA: Insufficient documentation

## 2023-05-29 DIAGNOSIS — Z79899 Other long term (current) drug therapy: Secondary | ICD-10-CM | POA: Diagnosis not present

## 2023-05-29 DIAGNOSIS — J45909 Unspecified asthma, uncomplicated: Secondary | ICD-10-CM | POA: Diagnosis not present

## 2023-05-29 DIAGNOSIS — Z7982 Long term (current) use of aspirin: Secondary | ICD-10-CM | POA: Diagnosis not present

## 2023-05-29 DIAGNOSIS — O471 False labor at or after 37 completed weeks of gestation: Principal | ICD-10-CM | POA: Insufficient documentation

## 2023-05-29 MED ORDER — ACETAMINOPHEN 325 MG PO TABS: 650.0000 mg | ORAL_TABLET | ORAL | Status: DC | PRN

## 2023-05-29 MED ORDER — CALCIUM CARBONATE ANTACID 500 MG PO CHEW: 2.0000 | CHEWABLE_TABLET | ORAL | Status: DC | PRN

## 2023-05-29 MED ORDER — ZOLPIDEM TARTRATE 5 MG PO TABS: 5.0000 mg | ORAL_TABLET | Freq: Every evening | ORAL | Status: DC | PRN
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
## 2023-05-29 NOTE — OB Triage Note (Signed)
Discharge instructions, labor precautions, and follow-up care reviewed with patient and patient's mother. All questions answered. Patient verbalized understanding. Discharged ambulatory off unit.   

## 2023-05-29 NOTE — OB Triage Note (Signed)
Patient is a [id number] at [redacted]w[redacted]d who presents to unit c/o pelvic pressure that comes and go along with occasional ctx that are difficult to time. Reports +FM, denies vaginal bleeding and LOF. External monitors applied and assessing. Initial FHT 135. Vital signs WDL.

## 2023-05-30 ENCOUNTER — Other Ambulatory Visit: Payer: Self-pay

## 2023-05-30 ENCOUNTER — Inpatient Hospital Stay
Admission: EM | Admit: 2023-05-30 | Discharge: 2023-06-01 | DRG: 806 | Disposition: A | Discharge status: Home / Self Care | Payer: Medicaid Other | Attending: Obstetrics | Admitting: Obstetrics

## 2023-05-30 DIAGNOSIS — O139 Gestational [pregnancy-induced] hypertension without significant proteinuria, unspecified trimester: Principal | ICD-10-CM | POA: Diagnosis present

## 2023-05-30 DIAGNOSIS — O9902 Anemia complicating childbirth: Secondary | ICD-10-CM | POA: Diagnosis present

## 2023-05-30 DIAGNOSIS — O99344 Other mental disorders complicating childbirth: Secondary | ICD-10-CM | POA: Diagnosis present

## 2023-05-30 DIAGNOSIS — F411 Generalized anxiety disorder: Secondary | ICD-10-CM | POA: Diagnosis present

## 2023-05-30 DIAGNOSIS — Z8659 Personal history of other mental and behavioral disorders: Secondary | ICD-10-CM

## 2023-05-30 DIAGNOSIS — O99891 Other specified diseases and conditions complicating pregnancy: Secondary | ICD-10-CM

## 2023-05-30 DIAGNOSIS — O99210 Obesity complicating pregnancy, unspecified trimester: Secondary | ICD-10-CM | POA: Diagnosis present

## 2023-05-30 DIAGNOSIS — O99824 Streptococcus B carrier state complicating childbirth: Secondary | ICD-10-CM | POA: Diagnosis present

## 2023-05-30 DIAGNOSIS — Z8249 Family history of ischemic heart disease and other diseases of the circulatory system: Secondary | ICD-10-CM | POA: Diagnosis not present

## 2023-05-30 DIAGNOSIS — O134 Gestational [pregnancy-induced] hypertension without significant proteinuria, complicating childbirth: Secondary | ICD-10-CM | POA: Diagnosis present

## 2023-05-30 DIAGNOSIS — F339 Major depressive disorder, recurrent, unspecified: Secondary | ICD-10-CM | POA: Diagnosis present

## 2023-05-30 DIAGNOSIS — Z3A37 37 weeks gestation of pregnancy: Secondary | ICD-10-CM

## 2023-05-30 DIAGNOSIS — R030 Elevated blood-pressure reading, without diagnosis of hypertension: Secondary | ICD-10-CM | POA: Diagnosis present

## 2023-05-30 DIAGNOSIS — O99214 Obesity complicating childbirth: Secondary | ICD-10-CM | POA: Diagnosis present

## 2023-05-30 DIAGNOSIS — Z7982 Long term (current) use of aspirin: Secondary | ICD-10-CM

## 2023-05-30 DIAGNOSIS — B951 Streptococcus, group B, as the cause of diseases classified elsewhere: Secondary | ICD-10-CM | POA: Diagnosis present

## 2023-05-30 DIAGNOSIS — F32A Depression, unspecified: Secondary | ICD-10-CM | POA: Diagnosis present

## 2023-05-30 LAB — CBC WITH DIFFERENTIAL/PLATELET
Abs Immature Granulocytes: 0.04 10*3/uL (ref 0.00–0.07)
Basophils Absolute: 0 10*3/uL (ref 0.0–0.1)
Basophils Relative: 0 %
Eosinophils Absolute: 0.1 10*3/uL (ref 0.0–0.5)
Eosinophils Relative: 1 %
HCT: 29.6 % — ABNORMAL LOW (ref 36.0–46.0)
Hemoglobin: 9.6 g/dL — ABNORMAL LOW (ref 12.0–15.0)
Immature Granulocytes: 1 %
Lymphocytes Relative: 19 %
Lymphs Abs: 1.2 10*3/uL (ref 0.7–4.0)
MCH: 27 pg (ref 26.0–34.0)
MCHC: 32.4 g/dL (ref 30.0–36.0)
MCV: 83.4 fL (ref 80.0–100.0)
Monocytes Absolute: 0.5 10*3/uL (ref 0.1–1.0)
Monocytes Relative: 7 %
Neutro Abs: 4.5 10*3/uL (ref 1.7–7.7)
Neutrophils Relative %: 72 %
Platelets: 224 10*3/uL (ref 150–400)
RBC: 3.55 MIL/uL — ABNORMAL LOW (ref 3.87–5.11)
RDW: 13.7 % (ref 11.5–15.5)
WBC: 6.3 10*3/uL (ref 4.0–10.5)
nRBC: 0 % (ref 0.0–0.2)

## 2023-05-30 LAB — TYPE AND SCREEN
ABO/RH(D): O POS
Antibody Screen: NEGATIVE

## 2023-05-30 LAB — ROM PLUS (ARMC ONLY): Rom Plus: NEGATIVE

## 2023-05-30 LAB — CBC
HCT: 30.5 % — ABNORMAL LOW (ref 36.0–46.0)
Hemoglobin: 10.2 g/dL — ABNORMAL LOW (ref 12.0–15.0)
MCH: 27.6 pg (ref 26.0–34.0)
MCHC: 33.4 g/dL (ref 30.0–36.0)
MCV: 82.7 fL (ref 80.0–100.0)
Platelets: 222 10*3/uL (ref 150–400)
RBC: 3.69 MIL/uL — ABNORMAL LOW (ref 3.87–5.11)
RDW: 13.8 % (ref 11.5–15.5)
WBC: 7.6 10*3/uL (ref 4.0–10.5)
nRBC: 0 % (ref 0.0–0.2)

## 2023-05-30 LAB — COMPREHENSIVE METABOLIC PANEL
ALT: 11 U/L (ref 0–44)
AST: 17 U/L (ref 15–41)
Albumin: 2.7 g/dL — ABNORMAL LOW (ref 3.5–5.0)
Alkaline Phosphatase: 109 U/L (ref 38–126)
Anion gap: 9 (ref 5–15)
BUN: <5 mg/dL — ABNORMAL LOW (ref 6–20)
CO2: 21 mmol/L — ABNORMAL LOW (ref 22–32)
Calcium: 8.4 mg/dL — ABNORMAL LOW (ref 8.9–10.3)
Chloride: 106 mmol/L (ref 98–111)
Creatinine, Ser: 0.48 mg/dL (ref 0.44–1.00)
GFR, Estimated: >60 mL/min (ref >60)
Glucose, Bld: 120 mg/dL — ABNORMAL HIGH (ref 70–99)
Potassium: 3.6 mmol/L (ref 3.5–5.1)
Sodium: 136 mmol/L (ref 135–145)
Total Bilirubin: 0.4 mg/dL (ref 0.3–1.2)
Total Protein: 6.5 g/dL (ref 6.5–8.1)

## 2023-05-30 LAB — PROTEIN / CREATININE RATIO, URINE
Creatinine, Urine: 109 mg/dL
Protein Creatinine Ratio: 0.1 mg/mg{Cre} (ref 0.00–0.15)
Total Protein, Urine: 11 mg/dL

## 2023-05-30 MED ORDER — LIDOCAINE HCL (PF) 1 % IJ SOLN
30.0000 mL | INTRAMUSCULAR | Status: DC | PRN
  Filled 2023-05-30: qty 30

## 2023-05-30 MED ORDER — MISOPROSTOL 25 MCG QUARTER TABLET
25.0000 ug | ORAL_TABLET | Freq: Once | ORAL | Status: AC
  Administered 2023-05-30: 25 ug via ORAL
  Filled 2023-05-30: qty 1

## 2023-05-30 MED ORDER — MISOPROSTOL 25 MCG QUARTER TABLET
25.0000 ug | ORAL_TABLET | ORAL | Status: DC
  Administered 2023-05-31: 25 ug via ORAL
  Filled 2023-05-30: qty 1

## 2023-05-30 MED ORDER — FAMOTIDINE IN NACL 20-0.9 MG/50ML-% IV SOLN: 20.0000 mg | INTRAVENOUS | Status: DC | PRN

## 2023-05-30 MED ORDER — FAMOTIDINE IN NACL 20-0.9 MG/50ML-% IV SOLN
20.0000 mg | Freq: Once | INTRAVENOUS | Status: DC
  Filled 2023-05-30: qty 50

## 2023-05-30 MED ORDER — LACTATED RINGERS IV SOLN
500.0000 mL | INTRAVENOUS | Status: DC | PRN
  Administered 2023-05-31: 500 mL via INTRAVENOUS

## 2023-05-30 MED ORDER — DIPHENHYDRAMINE HCL 50 MG/ML IJ SOLN
50.0000 mg | Freq: Once | INTRAMUSCULAR | Status: AC
  Administered 2023-05-30: 50 mg via INTRAVENOUS
  Filled 2023-05-30: qty 1

## 2023-05-30 MED ORDER — MISOPROSTOL 25 MCG QUARTER TABLET
ORAL_TABLET | ORAL | Status: AC
  Administered 2023-05-30: 25 ug via VAGINAL
  Filled 2023-05-30: qty 1

## 2023-05-30 MED ORDER — OXYTOCIN 10 UNIT/ML IJ SOLN
INTRAMUSCULAR | Status: AC
  Filled 2023-05-30: qty 2

## 2023-05-30 MED ORDER — DIPHENHYDRAMINE HCL 25 MG PO CAPS
50.0000 mg | ORAL_CAPSULE | Freq: Once | ORAL | Status: AC | PRN
  Administered 2023-05-31: 50 mg via ORAL
  Filled 2023-05-30: qty 2

## 2023-05-30 MED ORDER — SOD CITRATE-CITRIC ACID 500-334 MG/5ML PO SOLN: 30.0000 mL | ORAL | Status: DC | PRN

## 2023-05-30 MED ORDER — OXYTOCIN BOLUS FROM INFUSION
333.0000 mL | Freq: Once | INTRAVENOUS | Status: AC
  Administered 2023-05-31: 333 mL via INTRAVENOUS

## 2023-05-30 MED ORDER — MISOPROSTOL 25 MCG QUARTER TABLET
ORAL_TABLET | ORAL | Status: AC
  Administered 2023-05-30: 25 ug via ORAL
  Filled 2023-05-30: qty 1

## 2023-05-30 MED ORDER — VANCOMYCIN HCL 10 G IV SOLR
1750.0000 mg | Freq: Three times a day (TID) | INTRAVENOUS | Status: DC
  Administered 2023-05-30 – 2023-05-31 (×2): 1750 mg via INTRAVENOUS
  Filled 2023-05-30 (×3): qty 17.5

## 2023-05-30 MED ORDER — VANCOMYCIN HCL 10 G IV SOLR: 20.0000 mg/kg | Freq: Three times a day (TID) | INTRAVENOUS | Status: DC

## 2023-05-30 MED ORDER — MISOPROSTOL 25 MCG QUARTER TABLET
25.0000 ug | ORAL_TABLET | Freq: Once | ORAL | Status: AC
  Administered 2023-05-30: 25 ug via VAGINAL
  Filled 2023-05-30: qty 1

## 2023-05-30 MED ORDER — FENTANYL CITRATE (PF) 100 MCG/2ML IJ SOLN: 50.0000 ug | INTRAMUSCULAR | Status: DC | PRN

## 2023-05-30 MED ORDER — ONDANSETRON HCL 4 MG/2ML IJ SOLN: 4.0000 mg | Freq: Four times a day (QID) | INTRAMUSCULAR | Status: DC | PRN

## 2023-05-30 MED ORDER — MISOPROSTOL 25 MCG QUARTER TABLET
25.0000 ug | ORAL_TABLET | ORAL | Status: DC
  Administered 2023-05-31: 25 ug via VAGINAL
  Filled 2023-05-30: qty 1

## 2023-05-30 MED ORDER — OXYTOCIN-SODIUM CHLORIDE 30-0.9 UT/500ML-% IV SOLN
1.0000 m[IU]/min | INTRAVENOUS | Status: DC
  Filled 2023-05-30: qty 500

## 2023-05-30 MED ORDER — OXYCODONE-ACETAMINOPHEN 5-325 MG PO TABS: 2.0000 | ORAL_TABLET | ORAL | Status: DC | PRN

## 2023-05-30 MED ORDER — MISOPROSTOL 200 MCG PO TABS
ORAL_TABLET | ORAL | Status: AC
  Filled 2023-05-30: qty 4

## 2023-05-30 MED ORDER — LACTATED RINGERS IV SOLN: INTRAVENOUS | Status: DC

## 2023-05-30 MED ORDER — AMMONIA AROMATIC IN INHA
RESPIRATORY_TRACT | Status: AC
  Filled 2023-05-30: qty 10

## 2023-05-30 MED ORDER — TERBUTALINE SULFATE 1 MG/ML IJ SOLN: 0.2500 mg | Freq: Once | INTRAMUSCULAR | Status: DC | PRN

## 2023-05-30 MED ORDER — FAMOTIDINE IN NACL 20-0.9 MG/50ML-% IV SOLN
20.0000 mg | Freq: Two times a day (BID) | INTRAVENOUS | Status: DC | PRN
  Administered 2023-05-31: 20 mg via INTRAVENOUS

## 2023-05-30 MED ORDER — ACETAMINOPHEN 325 MG PO TABS: 650.0000 mg | ORAL_TABLET | ORAL | Status: DC | PRN

## 2023-05-30 MED ORDER — OXYCODONE-ACETAMINOPHEN 5-325 MG PO TABS: 1.0000 | ORAL_TABLET | ORAL | Status: DC | PRN

## 2023-05-30 MED ORDER — OXYTOCIN-SODIUM CHLORIDE 30-0.9 UT/500ML-% IV SOLN: 2.5000 [IU]/h | INTRAVENOUS | Status: DC

## 2023-05-30 NOTE — Progress Notes (Signed)
Labor Progress Note  Ruth Robinson is a 22 y.o. [phone] at [redacted]w[redacted]d by ultrasound admitted for induction of labor due to Hypertension.  Subjective: Pt is comfortable, denies cramping or contractions  Objective: BP 125/80 (BP Location: Left Arm)   Pulse 88   Temp 98.2 F (36.8 C) (Oral)   Resp 15   Ht 5\' 1"  (1.549 m)   Wt 90.3 kg   Breastfeeding Unknown   BMI 37.60 kg/m   Fetal Assessment: FHT:  FHR: 125 bpm, variability: moderate,  accelerations:  Present,  decelerations:  Absent Category/reactivity:  Category I UC:   occasional  SVE:    Dilation: 2cm  Effacement: Long  Station:  -3  Consistency: ---  Position: posterior  Membrane status: Intact Amniotic color: N/a  Labs: Lab Results  Component Value Date   WBC 7.6 05/30/2023   HGB 10.2 (L) 05/30/2023   HCT 30.5 (L) 05/30/2023   MCV 82.7 05/30/2023   PLT 222 05/30/2023    Assessment / Plan: Induction of labor due to gestational hypertension 1142 SVE 2/TH/-3 1733 Cytotec PO and PV  Labor: Progressing normally Preeclampsia:   130/83, Labs stable Fetal Wellbeing:  Category I Pain Control:  Labor support without medications I/D:   Afebrile, GBS pos - will start Vancomycin, Intact Anticipated MOD:  NSVD  Cyril Mourning, CNM 05/30/2023, 9:09 PM

## 2023-05-30 NOTE — Progress Notes (Addendum)
Pharmacy Antibiotic Note  Ruth Robinson is a 22 y.o. pregnant female admitted on 05/30/2023 with [redacted]w[redacted]d gestation.  Pharmacy has been consulted for vancomycin dosing.    Temp (24hrs), Avg:99.1 F (37.3 C), Min:99.1 F (37.3 C), Max:99.1 F (37.3 C)  Recent Labs  Lab 05/30/23 1046  WBC 6.3  CREATININE 0.48    Estimated Creatinine Clearance: 112.6 mL/min (by C-G formula based on SCr of 0.48 mg/dL).    Allergies  Allergen Reactions   Amoxicillin-Pot Clavulanate Hives    (Augmentin)   Goal of Therapy:  Vancomycin Trough 15-20 mg/L  Plan:  Vancomycin 20 mg/kg mg IV every 8 hrs  Check Scr with next labs if vancomycin continued beyond 48 hours. Will check vancomycin trough level prior to 4th or 5th dose.   Antimicrobials this admission: 06/06 vancomycin >>    Thank you for allowing pharmacy to be a part of this patient's care.  Lowella Bandy 05/30/2023 3:35 PM

## 2023-05-30 NOTE — Progress Notes (Signed)
Patient intermittently bouncing on birthing ball and ambulating around room. External monitors intermittently tracing maternal HR due to movement of patient. Adjusting monitors as needed, pulse ox applied.
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
## 2023-05-30 NOTE — Progress Notes (Signed)
Labor Progress Note  Ruth Robinson is a 22 y.o. [phone] at [redacted]w[redacted]d by ultrasound admitted for induction of labor due to Hypertension.  Subjective: Feeling cramping, particularly in her back  Objective: BP 125/80 (BP Location: Left Arm)   Pulse 88   Temp 98.2 F (36.8 C) (Oral)   Resp 15   Ht 5\' 1"  (1.549 m)   Wt 90.3 kg   Breastfeeding Unknown   BMI 37.60 kg/m   Fetal Assessment: FHT:  FHR: 130 bpm, variability: moderate,  accelerations:  Present,  decelerations:  Absent Category/reactivity:  Category I UC:   2-4 min  SVE:    Dilation: 2cm  Effacement: 60, 70  Station:  -3  Consistency: medium  Position: posterior  Membrane status: Intact Amniotic color: N/a  Labs: Lab Results  Component Value Date   WBC 7.6 05/30/2023   HGB 10.2 (L) 05/30/2023   HCT 30.5 (L) 05/30/2023   MCV 82.7 05/30/2023   PLT 222 05/30/2023    Assessment / Plan: Induction of labor due to gestational hypertension 1142 SVE 2/TH/-3 1733 Cytotec PO and PV 1933 Benadryl 50mg  IV given for Vancomycin reaction - Dr. Dalbert Garnet notified 2211 SVE 2/60, 70/-3 2213 Cytotec PO and PV  Labor: Progressing normally Preeclampsia:   125/80, Labs stable Fetal Wellbeing:  Category I Pain Control:  Labor support without medications I/D:   Afebrile, GBS pos - will start Vancomycin, Intact Anticipated MOD:  NSVD  Cyril Mourning, CNM 05/30/2023, 10:31 PM

## 2023-05-30 NOTE — OB Triage Note (Signed)
Patient is a 22 yo, G3P1, at 37 weeks and 1 day. Patient presents to L&D for [facility] evaluation from clinic after an elevated blood pressure. Patient denies any vaginal bleeding. Pt reports having leaking of fluid for the past week and a half. Patient reports having contractions for the past 2 days. Currently rating it an 8/10. Patient reports +FM. Patient denies any headaches, blurry vision, epigastric pain, and any sudden swelling. Patient has plus 2 reflexes and is absent for clonus. Monitors applied and assessing. Initial blood pressure is 122/81. Initial fetal heart tone is 150. Annamary Rummage CNM notified of patients arrival to unit. Plan to place in observation for pre PIH evaluations, assess blood pressure q15 minutes, and perform a ROM+.

## 2023-05-31 ENCOUNTER — Encounter: Payer: Self-pay | Admitting: Obstetrics and Gynecology

## 2023-05-31 ENCOUNTER — Inpatient Hospital Stay: Payer: Medicaid Other | Admitting: Anesthesiology

## 2023-05-31 LAB — RPR: RPR Ser Ql: NONREACTIVE

## 2023-05-31 LAB — URINE DRUG SCREEN, QUALITATIVE (ARMC ONLY)
Amphetamines, Ur Screen: NOT DETECTED
Barbiturates, Ur Screen: NOT DETECTED
Benzodiazepine, Ur Scrn: NOT DETECTED
Cannabinoid 50 Ng, Ur ~~LOC~~: NOT DETECTED
Cocaine Metabolite,Ur ~~LOC~~: NOT DETECTED
MDMA (Ecstasy)Ur Screen: NOT DETECTED
Methadone Scn, Ur: NOT DETECTED
Opiate, Ur Screen: NOT DETECTED
Phencyclidine (PCP) Ur S: NOT DETECTED
Tricyclic, Ur Screen: NOT DETECTED

## 2023-05-31 MED ORDER — IBUPROFEN 600 MG PO TABS
600.0000 mg | ORAL_TABLET | Freq: Four times a day (QID) | ORAL | Status: DC
  Administered 2023-05-31 – 2023-06-01 (×6): 600 mg via ORAL
  Filled 2023-05-31 (×6): qty 1

## 2023-05-31 MED ORDER — EPHEDRINE 5 MG/ML INJ: 10.0000 mg | INTRAVENOUS | Status: DC | PRN

## 2023-05-31 MED ORDER — LACTATED RINGERS IV SOLN: 500.0000 mL | Freq: Once | INTRAVENOUS | Status: DC

## 2023-05-31 MED ORDER — ZOLPIDEM TARTRATE 5 MG PO TABS: 5.0000 mg | ORAL_TABLET | Freq: Every evening | ORAL | Status: DC | PRN

## 2023-05-31 MED ORDER — SENNOSIDES-DOCUSATE SODIUM 8.6-50 MG PO TABS
2.0000 | ORAL_TABLET | Freq: Every day | ORAL | Status: DC
  Filled 2023-05-31: qty 2

## 2023-05-31 MED ORDER — SODIUM CHLORIDE 0.9 % IV SOLN: 250.0000 mL | INTRAVENOUS | Status: DC | PRN

## 2023-05-31 MED ORDER — SIMETHICONE 80 MG PO CHEW: 80.0000 mg | CHEWABLE_TABLET | ORAL | Status: DC | PRN

## 2023-05-31 MED ORDER — PHENYLEPHRINE 80 MCG/ML (10ML) SYRINGE FOR IV PUSH (FOR BLOOD PRESSURE SUPPORT): 80.0000 ug | PREFILLED_SYRINGE | INTRAVENOUS | Status: DC | PRN

## 2023-05-31 MED ORDER — SODIUM CHLORIDE 0.9% FLUSH
3.0000 mL | Freq: Two times a day (BID) | INTRAVENOUS | Status: DC
  Administered 2023-05-31 – 2023-06-01 (×3): 3 mL via INTRAVENOUS

## 2023-05-31 MED ORDER — ESCITALOPRAM OXALATE 10 MG PO TABS
20.0000 mg | ORAL_TABLET | Freq: Every day | ORAL | Status: DC
  Administered 2023-05-31 – 2023-06-01 (×2): 20 mg via ORAL
  Filled 2023-05-31: qty 2
  Filled 2023-05-31: qty 1

## 2023-05-31 MED ORDER — COCONUT OIL OIL: 1.0000 | TOPICAL_OIL | Status: DC | PRN

## 2023-05-31 MED ORDER — BENZOCAINE-MENTHOL 20-0.5 % EX AERO
1.0000 | INHALATION_SPRAY | CUTANEOUS | Status: DC | PRN
  Administered 2023-06-01: 1 via TOPICAL
  Filled 2023-05-31 (×2): qty 56

## 2023-05-31 MED ORDER — ONDANSETRON HCL 4 MG/2ML IJ SOLN: 4.0000 mg | INTRAMUSCULAR | Status: DC | PRN

## 2023-05-31 MED ORDER — FERROUS SULFATE 325 (65 FE) MG PO TABS
325.0000 mg | ORAL_TABLET | Freq: Two times a day (BID) | ORAL | Status: DC
  Administered 2023-05-31 – 2023-06-01 (×3): 325 mg via ORAL
  Filled 2023-05-31 (×3): qty 1

## 2023-05-31 MED ORDER — SODIUM CHLORIDE 0.9% FLUSH: 3.0000 mL | INTRAVENOUS | Status: DC | PRN

## 2023-05-31 MED ORDER — DIPHENHYDRAMINE HCL 25 MG PO CAPS: 25.0000 mg | ORAL_CAPSULE | Freq: Four times a day (QID) | ORAL | Status: DC | PRN

## 2023-05-31 MED ORDER — ACETAMINOPHEN 500 MG PO TABS
1000.0000 mg | ORAL_TABLET | Freq: Four times a day (QID) | ORAL | Status: DC
  Administered 2023-05-31 – 2023-06-01 (×6): 1000 mg via ORAL
  Filled 2023-05-31 (×6): qty 2

## 2023-05-31 MED ORDER — ONDANSETRON HCL 4 MG PO TABS: 4.0000 mg | ORAL_TABLET | ORAL | Status: DC | PRN

## 2023-05-31 MED ORDER — WITCH HAZEL-GLYCERIN EX PADS
1.0000 | MEDICATED_PAD | CUTANEOUS | Status: DC | PRN
  Administered 2023-06-01: 1 via TOPICAL
  Filled 2023-05-31: qty 100

## 2023-05-31 MED ORDER — FENTANYL-BUPIVACAINE-NACL 0.5-0.125-0.9 MG/250ML-% EP SOLN
EPIDURAL | Status: DC | PRN
  Administered 2023-05-31: 12 mL/h via EPIDURAL

## 2023-05-31 MED ORDER — FENTANYL-BUPIVACAINE-NACL 0.5-0.125-0.9 MG/250ML-% EP SOLN
EPIDURAL | Status: AC
  Filled 2023-05-31: qty 250

## 2023-05-31 MED ORDER — LIDOCAINE HCL (PF) 1 % IJ SOLN
INTRAMUSCULAR | Status: DC | PRN
  Administered 2023-05-31: 5 mL
  Administered 2023-05-31: 3 mL
  Administered 2023-05-31: 5 mL
  Administered 2023-05-31: 3 mL

## 2023-05-31 MED ORDER — FENTANYL-BUPIVACAINE-NACL 0.5-0.125-0.9 MG/250ML-% EP SOLN: 12.0000 mL/h | EPIDURAL | Status: DC | PRN

## 2023-05-31 MED ORDER — DIBUCAINE (PERIANAL) 1 % EX OINT: 1.0000 | TOPICAL_OINTMENT | CUTANEOUS | Status: DC | PRN

## 2023-05-31 MED ORDER — WITCH HAZEL-GLYCERIN EX PADS
MEDICATED_PAD | CUTANEOUS | Status: AC
  Filled 2023-05-31: qty 100

## 2023-05-31 MED ORDER — PRENATAL MULTIVITAMIN CH
1.0000 | ORAL_TABLET | Freq: Every day | ORAL | Status: DC
  Administered 2023-05-31 – 2023-06-01 (×2): 1 via ORAL
  Filled 2023-05-31 (×2): qty 1

## 2023-05-31 MED ORDER — DIPHENHYDRAMINE HCL 50 MG/ML IJ SOLN: 12.5000 mg | INTRAMUSCULAR | Status: DC | PRN

## 2023-05-31 NOTE — Anesthesia Procedure Notes (Signed)
Epidural Patient location during procedure: OB Start time: 05/31/2023 5:14 AM End time: 05/31/2023 5:17 AM  Staffing Anesthesiologist: Louie Boston, MD Performed: anesthesiologist   Preanesthetic Checklist Completed: patient identified, IV checked, site marked, risks and benefits discussed, surgical consent, monitors and equipment checked, pre-op evaluation and timeout performed  Epidural Patient position: sitting Prep: ChloraPrep Patient monitoring: heart rate, continuous pulse ox and blood pressure Approach: midline Location: L3-L4 Injection technique: LOR saline  Needle:  Needle type: Tuohy  Needle gauge: 17 G Needle length: 9 cm and 9 Needle insertion depth: 6 cm Catheter type: closed end flexible Catheter size: 19 Gauge Catheter at skin depth: 11 cm Test dose: negative and 1.5% lidocaine with Epi 1:200 K  Assessment Sensory level: T10 Events: blood not aspirated, injection not painful, no injection resistance, no paresthesia and negative IV test  Additional Notes 1 attempt Pt. Evaluated and documentation done after procedure finished. Patient identified. Risks/Benefits/Options discussed with patient including but not limited to bleeding, infection, nerve damage, paralysis, failed block, incomplete pain control, headache, blood pressure changes, nausea, vomiting, reactions to medication both or allergic, itching and postpartum back pain. Confirmed with bedside nurse the patient's most recent platelet count. Confirmed with patient that they are not currently taking any anticoagulation, have any bleeding history or any family history of bleeding disorders. Patient expressed understanding and wished to proceed. All questions were answered. Sterile technique was used throughout the entire procedure. Please see nursing notes for vital signs. Test dose was given through epidural catheter and negative prior to continuing to dose epidural or start infusion. Warning signs of high block  given to the patient including shortness of breath, tingling/numbness in hands, complete motor block, or any concerning symptoms with instructions to call for help. Patient was given instructions on fall risk and not to get out of bed. All questions and concerns addressed with instructions to call with any issues or inadequate analgesia.    Patient tolerated the insertion well without immediate complications. Reason for block:procedure for pain
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
## 2023-05-31 NOTE — Discharge Instructions (Signed)

## 2023-05-31 NOTE — Discharge Summary (Signed)
Obstetrical Discharge Summary  Patient Name: Ruth Robinson DOB: 02-Nov-2001 MRN: [id number]  Date of Admission: 05/30/2023 Date of Delivery: 05/31/2023 Delivered by: Ruth Robinson, CNM  Date of Discharge: 06/01/2023  [facility] [id number] last menstrual period was 08/22/2022 (exact date). EDC Estimated Date of Delivery: 06/18/23 Gestational Age at Delivery: [redacted]w[redacted]d   Antepartum complications:  History of preeclampsia  Depression History of PPD Anemia  GBS positive - Clindamycin Resistant   Admitting Diagnosis: Gestational hypertension [O13.9]  Secondary Diagnosis: Patient Active Problem List   Diagnosis Date Noted   Gestational hypertension 05/30/2023   Positive GBS test 05/21/2023   Major depressive disorder, recurrent episode, moderate (HCC) 02/25/2023   Generalized anxiety disorder 02/25/2023   History of postpartum depression, currently pregnant 11/22/2022   Depression complicating pregnancy, antepartum 11/22/2022   History of pre-eclampsia 10/31/2022   Obesity in pregnancy, antepartum 10/31/2022   History of chlamydia infection 01/26/2019   History of gonorrhea 01/26/2019   Anxiety and depression 07/12/2017   History of marijuana use 04/24/2017   History of posttraumatic stress disorder (PTSD) 12/14/2016   Asthma 11/06/2016   Vitiligo 11/06/2016   Eczema 11/06/2016   Recurrent major depressive disorder (HCC) 11/06/2016   Migraines 11/06/2016    Discharge Diagnosis: Term Pregnancy Delivered and Gestational Hypertension      Induction: Cytotec Complications: None Intrapartum complications/course: Ruth Robinson presented to L&D with elevated blood pressures. Misoprostol was used for cervical ripening. She started to labor after SROM. She progressed well to C/C/-1 with a strong urge to push.  She pushed effectively over approximately 50 minutes for a spontaneous vaginal birth.  Delivery Type: spontaneous vaginal delivery Anesthesia: epidural  anesthesia Placenta: spontaneous To Pathology: No  Laceration: none Episiotomy: none Newborn Data: Live born female  Birth Weight: 6 lb 13 oz (3090 g) APGAR: 6, 9  Newborn Delivery   Birth date/time: 05/31/2023 08:41:00 Delivery type: Vaginal, Spontaneous      Postpartum Procedures: none Edinburgh:     05/31/2023   11:03 PM 07/11/2017    3:58 PM  Edinburgh Postnatal Depression Scale Screening Tool  I have been able to laugh and see the funny side of things. 0 2  I have looked forward with enjoyment to things. 0 0  I have blamed myself unnecessarily when things went wrong. 2 3  I have been anxious or worried for no good reason. 0 3  I have felt scared or panicky for no good reason. 1 3  Things have been getting on top of me. 2 2  I have been so unhappy that I have had difficulty sleeping. 0 3  I have felt sad or miserable. 1 3  I have been so unhappy that I have been crying. 1 3  The thought of harming myself has occurred to me. 0 3  Edinburgh Postnatal Depression Scale Total 7 25     Post partum course:  Patient had an uncomplicated postpartum course.  By time of discharge on PPD#1, her pain was controlled on oral pain medications; she had appropriate lochia and was ambulating, voiding without difficulty and tolerating regular diet.  She was deemed stable for discharge to home.    Discharge Physical Exam:  BP (!) 131/98 (BP Location: Left Arm) Comment: Notify Ruth Toranin, RN of bp  Pulse 77   Temp 98 F (36.7 C) (Oral)   Resp 20   Ht 5\' 1"  (1.549 m)   Wt 90.3 kg   LMP 08/22/2022 (Exact Date)   SpO2 100%  Breastfeeding Unknown   BMI 37.60 kg/m   General: NAD CV: RRR Pulm: CTABL, nl effort ABD: s/nd/nt, fundus firm and below the umbilicus Lochia: moderate Perineum: minimal edema/intact DVT Evaluation: LE non-ttp, no evidence of DVT on exam.  Hemoglobin  Date Value Ref Range Status  06/01/2023 9.8 (L) 12.0 - 15.0 g/dL Final  16/09/9603 54.0 11.1 - 15.9 g/dL Final    HCT  Date Value Ref Range Status  06/01/2023 30.0 (L) 36.0 - 46.0 % Final   Hematocrit  Date Value Ref Range Status  06/03/2017 31.7 (L) 34.0 - 46.6 % Final    Risk assessment for postpartum VTE and prophylactic treatment: Very high risk factors: None High risk factors: None Moderate risk factors: BMI 30-40 kg/m2  Postpartum VTE prophylaxis with LMWH not indicated  Disposition: stable, discharge to home. Baby Feeding: breast feeding and formula feeding Baby Disposition: home with mom  Rh Immune globulin indicated: No Rubella vaccine given: was not indicated Varivax vaccine given: was not indicated Flu vaccine given in AP setting: N/A Tdap vaccine given in AP setting: Yes   Contraception:  Liletta IUD -Message sent to order for 6 week postpartum visit   Prenatal Labs:  Blood type/Rh O pos  Antibody screen neg  Rubella Immune  Varicella Immune  RPR NR  HBsAg Neg  HIV NR  GC neg  Chlamydia neg  Genetic screening negative  1 hour GTT 170  3 hour GTT [phone]   GBS Pos    Plan:  Ruth Robinson was discharged to home in good condition. Follow-up appointment in 2 days for BP check and with delivering provider in 2 weeks.  Discharge Medications: Allergies as of 06/01/2023       Reactions   Amoxicillin-pot Clavulanate Hives   (Augmentin)        Medication List     TAKE these medications    acetaminophen 500 MG tablet Commonly known as: TYLENOL Take 2 tablets (1,000 mg total) by mouth every 6 (six) hours.   benzocaine-Menthol 20-0.5 % Aero Commonly known as: DERMOPLAST Apply 1 Application topically as needed for irritation (perineal discomfort).   coconut oil Oil Apply 1 Application topically as needed.   dibucaine 1 % Oint Commonly known as: NUPERCAINAL Place 1 Application rectally as needed for hemorrhoids.   escitalopram 20 MG tablet Commonly known as: LEXAPRO Take 20 mg by mouth daily.   ferrous sulfate 325 (65 FE) MG  tablet Take 1 tablet (325 mg total) by mouth 2 (two) times daily with a meal.   ibuprofen 600 MG tablet Commonly known as: ADVIL Take 1 tablet (600 mg total) by mouth every 6 (six) hours.   NIFEdipine 30 MG 24 hr tablet Commonly known as: ADALAT CC Take 1 tablet (30 mg total) by mouth daily. Start taking on: June 02, 2023   prenatal multivitamin Tabs tablet Take 1 tablet by mouth daily at 12 noon.   ProAir HFA 108 (90 Base) MCG/ACT inhaler Generic drug: albuterol INHALE 2 PUFFS BY MOUTH EVERY 4 TO 6 HOURS AS NEEDED   senna-docusate 8.6-50 MG tablet Commonly known as: Senokot-S Take 2 tablets by mouth daily. Start taking on: June 02, 2023   simethicone 80 MG chewable tablet Commonly known as: MYLICON Chew 1 tablet (80 mg total) by mouth as needed for flatulence.   witch hazel-glycerin pad Commonly known as: TUCKS Apply 1 Application topically as needed for hemorrhoids.         Follow-up Information     Tami Lin,  Marlana Salvage, CNM. Schedule an appointment as soon as possible for a visit in 2 week(s).   Specialty: Certified Nurse Midwife Why: postpartum mood check Contact information: [address] [place] [address] [phone]         Gustavo Lah, CNM Follow up in 6 week(s).   Specialty: Certified Nurse Midwife Why: postpartum visit and IUD insertion Contact information: [address] [place] [address] [phone]         [facility] Follow up in 2 day(s).   Why: BP check Contact information: [address] [place] [address] [phone]                Signed: Chari Manning CNM

## 2023-06-01 LAB — CBC
HCT: 30 % — ABNORMAL LOW (ref 36.0–46.0)
Hemoglobin: 9.8 g/dL — ABNORMAL LOW (ref 12.0–15.0)
MCH: 27 pg (ref 26.0–34.0)
MCHC: 32.7 g/dL (ref 30.0–36.0)
MCV: 82.6 fL (ref 80.0–100.0)
Platelets: 214 10*3/uL (ref 150–400)
RBC: 3.63 MIL/uL — ABNORMAL LOW (ref 3.87–5.11)
RDW: 13.4 % (ref 11.5–15.5)
WBC: 8 10*3/uL (ref 4.0–10.5)
nRBC: 0 % (ref 0.0–0.2)

## 2023-06-01 MED ORDER — NIFEDIPINE ER 30 MG PO TB24: 30.0000 mg | ORAL_TABLET | Freq: Every day | ORAL | 0 refills | Status: AC

## 2023-06-01 MED ORDER — BENZOCAINE-MENTHOL 20-0.5 % EX AERO: 1.0000 | INHALATION_SPRAY | CUTANEOUS | Status: AC | PRN

## 2023-06-01 MED ORDER — ACETAMINOPHEN 500 MG PO TABS: 1000.0000 mg | ORAL_TABLET | Freq: Four times a day (QID) | ORAL | 0 refills | Status: AC

## 2023-06-01 MED ORDER — SIMETHICONE 80 MG PO CHEW: 80.0000 mg | CHEWABLE_TABLET | ORAL | 0 refills | Status: AC | PRN

## 2023-06-01 MED ORDER — COCONUT OIL OIL: 1.0000 | TOPICAL_OIL | 0 refills | Status: AC | PRN

## 2023-06-01 MED ORDER — SENNOSIDES-DOCUSATE SODIUM 8.6-50 MG PO TABS: 2.0000 | ORAL_TABLET | Freq: Every day | ORAL | Status: AC

## 2023-06-01 MED ORDER — DIBUCAINE (PERIANAL) 1 % EX OINT: 1.0000 | TOPICAL_OINTMENT | CUTANEOUS | Status: AC | PRN

## 2023-06-01 MED ORDER — WITCH HAZEL-GLYCERIN EX PADS: 1.0000 | MEDICATED_PAD | CUTANEOUS | 12 refills | Status: AC | PRN

## 2023-06-01 MED ORDER — NIFEDIPINE ER OSMOTIC RELEASE 30 MG PO TB24
30.0000 mg | ORAL_TABLET | Freq: Every day | ORAL | Status: DC
  Administered 2023-06-01: 30 mg via ORAL
  Filled 2023-06-01: qty 1

## 2023-06-01 MED ORDER — IBUPROFEN 600 MG PO TABS: 600.0000 mg | ORAL_TABLET | Freq: Four times a day (QID) | ORAL | 0 refills | Status: AC

## 2023-06-01 MED ORDER — FERROUS SULFATE 325 (65 FE) MG PO TABS: 325.0000 mg | ORAL_TABLET | Freq: Two times a day (BID) | ORAL | 3 refills | Status: AC

## 2023-06-01 NOTE — Progress Notes (Signed)
CSW spoke with Ruth Robinson who stated she didn't need any assistance. Ruth Robinson stated that she receives therapy with Kathreen Cosier from the health department. Ruth Robinson stated she has been taking her 20 mg of lexapro and has had no concerns. Ruth Robinson stated she follows up with Marchelle Folks in two weeks. Ruth Robinson denied the need for additional resources.

## 2023-06-01 NOTE — Progress Notes (Signed)
Pt discharged home via [facility] per L&D nurse. All DC inst given to pt. No needs or concerns expressed.

## 2023-06-01 NOTE — Anesthesia Postprocedure Evaluation (Signed)
Anesthesia Post Note  Patient: Ruth Robinson  Procedure(s) Performed: AN AD HOC LABOR EPIDURAL  Patient location during evaluation: Mother Baby Anesthesia Type: Epidural Level of consciousness: awake and alert Pain management: pain level controlled Vital Signs Assessment: post-procedure vital signs reviewed and stable Respiratory status: spontaneous breathing, nonlabored ventilation and respiratory function stable Cardiovascular status: stable Postop Assessment: no headache, no backache and epidural receding Anesthetic complications: no   No notable events documented.   Last Vitals:  Vitals:   06/01/23 1234 06/01/23 1604  BP: 139/82 (!) 151/94  Pulse: 83 81  Resp: 20 20  Temp: 37.1 C 37.1 C  SpO2: 98% 100%    Last Pain:  Vitals:   06/01/23 1604  TempSrc: Oral  PainSc:                  Louie Boston

## 2023-06-03 ENCOUNTER — Ambulatory Visit: Payer: Medicaid Other | Admitting: Licensed Clinical Social Worker

## 2023-06-10 ENCOUNTER — Ambulatory Visit: Payer: Medicaid Other | Admitting: Licensed Clinical Social Worker

## 2023-06-10 DIAGNOSIS — F331 Major depressive disorder, recurrent, moderate: Secondary | ICD-10-CM

## 2023-06-10 DIAGNOSIS — F411 Generalized anxiety disorder: Secondary | ICD-10-CM

## 2023-06-10 NOTE — Progress Notes (Signed)
Counselor/Therapist Progress Note  Patient ID: AASHKA GASCHLER, MRN: [id number],    Date: 06/10/2023  Time Spent: 45 minutes    Treatment Type: Psychotherapy  Reported Symptoms:  Overall low mood with intermittent symptoms of increased emotions, loneliness, overwhelm, crying, anxiety anxious thoughts    Mental Status Exam:  Appearance:   Neat and Well Groomed     Behavior:  Appropriate, Sharing, and Motivated  Motor:  Normal  Speech/Language:   Clear and Coherent and Normal Rate  Affect:  Appropriate, Congruent, and Full Range  Mood:  normal  Thought process:  normal  Thought content:    WNL  Sensory/Perceptual disturbances:    WNL  Orientation:  oriented to person, place, time/date, situation, and day of week  Attention:  Good  Concentration:  Good  Memory:  WNL  Fund of knowledge:   Good  Insight:    Good  Judgment:   Fair  Impulse Control:  Fair   Risk Assessment: Danger to Self:  No Self-injurious Behavior: No Danger to Others: No Duty to Warn:no Physical Aggression / Violence:No  Access to Firearms a concern: No  Gang Involvement:No   Subjective: Patient clinic or provider recommended a 2 week mood check due to risk factors for postpartum mood disorder.  Patient delivered on 05/31/2023. She reports she is adjusting to new baby and parenting 6yo. She reports overall low mood with intermittent increase in emotions, sadness, crying and feeling overwhelmed. Patient reports that labor and delivery went well, she is bottle feeding and has support from her mom. She also reports a lot of anxiety related to a desire to move, start working, and has been worrying a lot about her ex / oldest child's father getting out of prison. Patient reports her appetite is good and is snacking a lot throughout the day, she reports she is mostly able to sleep when baby sleeps but sometimes has difficulties winding back down. She doesn't report any checking behaviors related to baby's wellbeing.  Patient remains motivated for treatment and scheduled follow up session.    Interventions: Cognitive Behavioral Therapy and Client Centered  Established psychological safety. LCSW conducted postpartum mood check, including   Conducted brief assessment  Provide brief psychoeducation on postpartum mood and anxiety disorders signs and symptoms, including information on Baby Blues, Postpartum Depression, and Postpartum Anxiety.   Discussed self-care strategies to prevent and/or reduce symptoms of postpartum mood and anxiety disorders, including sleep hygiene, eating regularly, drinking fluids, getting outside, and support from partner, family, and/or friends.   Informed patient to call her provider right away or get emergency help if she experiences any of the following symptoms: Feelings of hopelessness and total despair. Feeling out of touch with reality (hearing or seeing things other people don't). Feeling that you might hurt yourself or your baby.  LCSW also discussed with patient her worries about her ex / first child's father getting out of prison 07/2024, LCSW encouraged patient to talk with [facility] for recommendations for safety planning. LCSW provided psychoeducation on trauma triggers / activating cues related to this postpartum period and reviewed mindfulness and grounding strategies to be more present in the moment.    Diagnosis:   ICD-10-CM   1. Major depressive disorder, recurrent episode, moderate (HCC)  F33.1     2. Generalized anxiety disorder  F41.1      Plan:  Patient's goal of treatment is to hopefully to come out and have a different outlook and not be in a hamster ball  and would like to look at things with the glass half-full versus half-empty and to also learn coping skills that work. Patient voices she would also like to do some trauma work / healing    Treatment Target: Understand the relationship between thoughts, emotions, and behaviors   Psychoeducation on CBTs model   Oriented the client to the therapeutic approach Teach the connection between thoughts, emotions, and behaviors    Treatment Target: Increase realistic balanced thinking -to learn how to replace thinking with thoughts that are more accurate or helpful Explore patient's thoughts, beliefs, automatic thoughts, assumptions  Identify and replace unhelpful thinking patterns (upsetting ideas, self-talk and mental images) Process trauma and distress and allow for emotional release and identification of "stuck points'  Questioning and challenging thoughts Cognitive reappraisal  Restructuring, Socratic questioning  Teach patient about core beliefs    Treatment Target: Increase sense of self - "who am I"  / Creating a life worth living  Values clarification  Self-care - nutrition, sleep, exercise  Mindfulness skills Emotional regulation skills  Interpersonal Effectiveness  Distress Tolerance Assist patient in developing goal and value directions   Future Appointments  Date Time Provider Department Center  06/24/2023  9:00 AM Kathreen Cosier, LCSW [facility] None     Kathreen Cosier, LCSW

## 2023-06-11 ENCOUNTER — Telehealth: Payer: Self-pay

## 2023-06-11 NOTE — Telephone Encounter (Signed)
[facility]- Discharge Call Backs-Left Voicemail about the following below. 1-Do you have any questions or concerns about yourself as you heal?  [facility]-Is your dressing off?/Vag Del? 2-Any concerns or questions about your baby? Is your baby eating, peeing,pooping well? 3-Where does your baby sleep in your home? Reviewed ABC's of safe sleep. 4-How was your stay at the hospital? 5- Did our team work together to care for you? You should be receiving a survey in the mail soon.   We would really appreciate it if you could fill that out for us and return it in the mail.  We value the feedback to make improvements and continue the great work we do.   If you have any questions please feel free to call me back at [phone]  

## 2023-06-24 ENCOUNTER — Ambulatory Visit: Payer: Medicaid Other | Admitting: Licensed Clinical Social Worker

## 2023-06-24 DIAGNOSIS — F331 Major depressive disorder, recurrent, moderate: Secondary | ICD-10-CM

## 2023-06-24 DIAGNOSIS — F411 Generalized anxiety disorder: Secondary | ICD-10-CM

## 2023-06-24 NOTE — Progress Notes (Signed)
Counselor/Therapist Progress Note  Patient ID: Ruth Robinson, MRN: [id number],    Date: 06/24/2023  Time Spent: 47 minutes    Treatment Type: Psychotherapy  Reported Symptoms:  "okay", "tired", mild anxiety, anxious thoughts, low mood, anhedonia, worthlessness   Mental Status Exam:  Appearance:   Casual and Well Groomed     Behavior:  Appropriate, Sharing, and Motivated  Motor:  Normal  Speech/Language:   Clear and Coherent and Normal Rate  Affect:  Appropriate, Congruent, and Full Range  Mood:  normal  Thought process:  normal  Thought content:    WNL  Sensory/Perceptual disturbances:    WNL  Orientation:  oriented to person, place, time/date, and situation  Attention:  Good  Concentration:  Good  Memory:  WNL  Fund of knowledge:   Good  Insight:    Fair  Judgment:   Fair  Impulse Control:  Fair   Risk Assessment: Danger to Self:  No Self-injurious Behavior: No Danger to Others: No Duty to Warn:no Physical Aggression / Violence:No  Access to Firearms a concern: No  Gang Involvement:No   Subjective: Patient was engaged and cooperative throughout the session using time effectively to discuss thoughts and feelings. Patient reports that she has been "okay", "tired" and had a lot going on this weekend. Patient voices continued motivation for treatment and was receptive to informaiton and intervention by LCSW. Patient plans to call [facility] for psychiatric med eval as discussed with OB and with LCSW. Patient is likely to benefit from future treatment and remains motivated to manage symptoms of depression and anxiety.   Interventions: Cognitive Behavioral Therapy and Client Centered  Established psychological safety. Checked in with patient regarding current symptoms, assessed symptoms.  LCSW processed with the patient how they have been doing since the last follow-up session - multiple stressors, including current and passed challenges with ex-boyfriend /  father of first child, not being where she would like to be in her life, living situation, and financial stressors. LCSW engaged patient in processing thoughts and emotions, providing support and validation, reframed unhelpful thoughts encouraging psychological flexibility, and reviewed mindfulness.  LCSW and patient also discussed OB recommendation of psychiatric med eval and provided contact information to [facility] mood disorder clinic.   Diagnosis:   ICD-10-CM   1. Major depressive disorder, recurrent episode, moderate (HCC)  F33.1     2. Generalized anxiety disorder  F41.1     ] Plan: Patient's goal of treatment is to hopefully to come out and have a different outlook and not be in a hamster ball and would like to look at things with the glass half-full versus half-empty and to also learn coping skills that work. Patient voices she would also like to do some trauma work / healing    Treatment Target: Understand the relationship between thoughts, emotions, and behaviors  Psychoeducation on CBTs model   Oriented the client to the therapeutic approach Teach the connection between thoughts, emotions, and behaviors    Treatment Target: Increase realistic balanced thinking -to learn how to replace thinking with thoughts that are more accurate or helpful Explore patient's thoughts, beliefs, automatic thoughts, assumptions  Identify and replace unhelpful thinking patterns (upsetting ideas, self-talk and mental images) Process trauma and distress and allow for emotional release and identification of "stuck points'  Questioning and challenging thoughts Cognitive reappraisal  Restructuring, Socratic questioning  Teach patient about core beliefs    Treatment Target: Increase sense of self - "who am I"  /  Creating a life worth living  Values clarification  Self-care - nutrition, sleep, exercise  Mindfulness skills Emotional regulation skills  Interpersonal Effectiveness  Distress Tolerance Assist  patient in developing goal and value directions   Future Appointments  Date Time Provider Department Center  07/08/2023  9:00 AM Kathreen Cosier, LCSW [facility] None      Kathreen Cosier, LCSW

## 2023-07-08 ENCOUNTER — Telehealth: Payer: Self-pay | Admitting: Licensed Clinical Social Worker

## 2023-07-08 ENCOUNTER — Ambulatory Visit: Payer: Medicaid Other | Admitting: Licensed Clinical Social Worker

## 2023-07-08 NOTE — Telephone Encounter (Signed)
Patient did not show to Zoom appt. LCSW attempted to call patient and left vm and waited for 10 minutes for patient to arrive. It is now 22 minutes past appt. And patient has not arrived or returned the call.

## 2023-07-15 ENCOUNTER — Ambulatory Visit: Payer: Medicaid Other | Admitting: Licensed Clinical Social Worker

## 2023-07-15 DIAGNOSIS — F331 Major depressive disorder, recurrent, moderate: Secondary | ICD-10-CM

## 2023-07-15 DIAGNOSIS — F411 Generalized anxiety disorder: Secondary | ICD-10-CM

## 2023-07-15 NOTE — Progress Notes (Signed)
Counselor/Therapist Progress Note  Patient ID: Ruth Robinson, MRN: [id number],    Date: 07/15/2023  Time Spent: 37 minutes    Treatment Type: Psychotherapy  Reported Symptoms:  Depressed mood, anxiety, anxious thoughts increased due to relationship conflict.   Mental Status Exam:  Appearance:   Casual and Well Groomed     Behavior:  Appropriate, Sharing, and Motivated  Motor:  Normal  Speech/Language:   Clear and Coherent and Normal Rate  Affect:  Appropriate, Congruent, and Full Range  Mood:  normal  Thought process:  normal  Thought content:    WNL  Sensory/Perceptual disturbances:    WNL  Orientation:  oriented to person, place, time/date, situation, and day of week  Attention:  Good  Concentration:  Good  Memory:  WNL  Fund of knowledge:   Good  Insight:    Fair  Judgment:   Fair  Impulse Control:  Fair   Risk Assessment: Danger to Self:  No Self-injurious Behavior: No Danger to Others: No Duty to Warn:no Physical Aggression / Violence:No  Access to Firearms a concern: No  Gang Involvement:No   Subjective: Patient was engaged and cooperative throughout the session using time effectively to discuss thoughts and feelings. Patient reports that she has been feeling really anxious due to relationship challenges with boyfriend regarding his cheating. Patient reports that she is trying to use mindfulness skills, but continues to struggle with all the "what ifs". Patient voices continued motivation for treatment and understanding of how her mental health symptoms are impacted by environmental stressors. Patient is likely to benefit from future treatment because she remains motivated to decrease symptoms and improve functioning. Patient is recommended to continue talk-therapy and medication management. Patient states that she has an August appointment with [facility] mood disorder clinic.   Interventions: Cognitive Behavioral Therapy and Client Centered  Established psychological  safety. LCSW met with patient to identify needs related to stressors and functioning, and assess and monitor for signs and symptoms of depression and anxiety, and assess safety. Checked in with patient regarding how they have been doing since the last follow-up session. LCSW assisted patient in processing their thoughts and emotions regarding relationship challenges, offered reflective listening and compassionate presence. LCSW reviewed acceptance, mindfulness, and reframing unhelpful thoughts. Provided support through active listening, validation of feelings, and highlighted patient's strengths.   Diagnosis:   ICD-10-CM   1. Major depressive disorder, recurrent episode, moderate (HCC)  F33.1     2. Generalized anxiety disorder  F41.1      Plan: Patient's goal of treatment is to hopefully to come out and have a different outlook and not be in a hamster ball and would like to look at things with the glass half-full versus half-empty and to also learn coping skills that work. Patient voices she would also like to do some trauma work / healing    Treatment Target: Understand the relationship between thoughts, emotions, and behaviors  Psychoeducation on CBTs model   Oriented the client to the therapeutic approach Teach the connection between thoughts, emotions, and behaviors    Treatment Target: Increase realistic balanced thinking -to learn how to replace thinking with thoughts that are more accurate or helpful Explore patient's thoughts, beliefs, automatic thoughts, assumptions  Identify and replace unhelpful thinking patterns (upsetting ideas, self-talk and mental images) Process trauma and distress and allow for emotional release and identification of "stuck points'  Questioning and challenging thoughts Cognitive reappraisal  Restructuring, Socratic questioning  Teach patient about core beliefs  Treatment Target: Increase sense of self - "who am I"  / Creating a life worth living  Values  clarification  Self-care - nutrition, sleep, exercise  Mindfulness skills Emotional regulation skills  Interpersonal Effectiveness  Distress Tolerance Assist patient in developing goal and value directions   Future Appointments  Date Time Provider Department Center  07/29/2023  9:00 AM Kathreen Cosier, LCSW [facility] None      Kathreen Cosier, LCSW

## 2023-07-29 ENCOUNTER — Ambulatory Visit: Payer: Medicaid Other | Admitting: Licensed Clinical Social Worker

## 2023-07-29 DIAGNOSIS — F331 Major depressive disorder, recurrent, moderate: Secondary | ICD-10-CM

## 2023-07-29 DIAGNOSIS — F411 Generalized anxiety disorder: Secondary | ICD-10-CM

## 2023-07-29 NOTE — Progress Notes (Signed)
Counselor/Therapist Progress Note  Patient ID: Ruth Robinson, MRN: [id number],    Date: 07/29/2023  Time Spent: 42 minutes    Treatment Type: Psychotherapy  Reported Symptoms:  Static symptoms; overall good with some ups and downs  Mental Status Exam:  Appearance:   Casual, Neat, and Well Groomed     Behavior:  Appropriate, Sharing, and Motivated  Motor:  Normal  Speech/Language:   Clear and Coherent and Normal Rate  Affect:  Appropriate, Congruent, and Full Range  Mood:  normal  Thought process:  normal  Thought content:    WNL  Sensory/Perceptual disturbances:    WNL  Orientation:  oriented to person, place, time/date, situation, and day of week  Attention:  Good  Concentration:  Good  Memory:  WNL  Fund of knowledge:   Good  Insight:    Fair  Judgment:   Fair  Impulse Control:  Fair   Risk Assessment: Danger to Self:  No Self-injurious Behavior: No Danger to Others: No Duty to Warn:no Physical Aggression / Violence:No  Access to Firearms a concern: No  Gang Involvement:No   Subjective: Patient was engaged and cooperative throughout the session using time effectively to discuss thoughts and feelings. She reports continued environmental stressors and mild anxiety symptoms and low mood. Patient was receptive to intervention and feedback from LCSW and voices continued motivation for treatment.   Interventions: Cognitive Behavioral Therapy and Client Centered  Established psychological safety. Checked in with patient regarding their week and processed with the patient how they have been doing since the last follow-up session. LCSW assisted patient in processing their thoughts and emotions regarding relationship challenges with boyfriend and with his family, reflecting and joining with the patient. Also discussed parenting challenges, provided [employer]ing Tips and resource to [employer] online. Provided support through active listening, validation of feelings, and  highlighted patient's strengths.  Diagnosis:   ICD-10-CM   1. Major depressive disorder, recurrent episode, moderate (HCC)  F33.1     2. Generalized anxiety disorder  F41.1       Plan: Patient's goal of treatment is to hopefully to come out and have a different outlook and not be in a hamster ball and would like to look at things with the glass half-full versus half-empty and to also learn coping skills that work. Patient voices she would also like to do some trauma work / healing    Treatment Target: Understand the relationship between thoughts, emotions, and behaviors  Psychoeducation on CBTs model   Oriented the client to the therapeutic approach Teach the connection between thoughts, emotions, and behaviors    Treatment Target: Increase realistic balanced thinking -to learn how to replace thinking with thoughts that are more accurate or helpful Explore patient's thoughts, beliefs, automatic thoughts, assumptions  Identify and replace unhelpful thinking patterns (upsetting ideas, self-talk and mental images) Process trauma and distress and allow for emotional release and identification of "stuck points'  Questioning and challenging thoughts Cognitive reappraisal  Restructuring, Socratic questioning  Teach patient about core beliefs    Treatment Target: Increase sense of self - "who am I"  / Creating a life worth living  Values clarification  Self-care - nutrition, sleep, exercise  Mindfulness skills Emotional regulation skills  Interpersonal Effectiveness  Distress Tolerance Assist patient in developing goal and value directions   Future Appointments  Date Time Provider Department Center  08/12/2023  9:00 AM Kathreen Cosier, LCSW [facility] None     Kathreen Cosier, LCSW

## 2023-08-12 ENCOUNTER — Ambulatory Visit: Payer: Medicaid Other | Admitting: Licensed Clinical Social Worker

## 2023-08-19 ENCOUNTER — Ambulatory Visit: Payer: Medicaid Other | Admitting: Licensed Clinical Social Worker

## 2023-08-19 DIAGNOSIS — F331 Major depressive disorder, recurrent, moderate: Secondary | ICD-10-CM

## 2023-08-19 DIAGNOSIS — F411 Generalized anxiety disorder: Secondary | ICD-10-CM

## 2023-08-19 NOTE — Progress Notes (Signed)
Counselor/Therapist Progress Note  Patient ID: Ruth Robinson, MRN: [id number],    Date: 08/19/2023  Time Spent: 45 minutes    Treatment Type: Psychotherapy  Reported Symptoms:  Depressed mood, sadness   Mental Status Exam:  Appearance:   Casual     Behavior:  Appropriate, Sharing, Care-Taking, and Motivated  Motor:  Normal  Speech/Language:   Clear and Coherent and Normal Rate  Affect:  Appropriate, Congruent, and Full Range  Mood:  normal  Thought process:  normal  Thought content:    WNL  Sensory/Perceptual disturbances:    WNL  Orientation:  oriented to person, place, time/date, situation, and day of week  Attention:  Good  Concentration:  Good  Memory:  WNL  Fund of knowledge:   Good  Insight:    Fair  Judgment:   Fair  Impulse Control:  Fair   Risk Assessment: Danger to Self:  No Self-injurious Behavior: No Danger to Others: No Duty to Warn:no Physical Aggression / Violence:No  Access to Firearms a concern: No  Gang Involvement:No   Subjective: Patient was receptive to feedback and intervention from LCSW and actively and effectively participated throughout the session. She reports having a difficult few days, experiencing a breakup, a foot injury, not having fun at birthday outing and further reports that she hasn't felt like herself for the past 3 weeks and describes possible dissociating - feeling alone in a room filled with others.   Interventions: Cognitive Behavioral Therapy and Client Centered  Established psychological safety. Checked in with patient regarding how they have been doing since the last follow-up session. Provided supportive space for patient to process their thoughts and emotions regarding challenges with father of baby / boyfriend. Discussed coping skills to regulate emotions, including distancing herself from her now ex-boyfriend, reframing thoughts and reviewed the importance of self-care and engaging with positive supports.  Provided  support through active listening, validation of feelings, and highlighted patient's strengths.   Diagnosis:   ICD-10-CM   1. Major depressive disorder, recurrent episode, moderate (HCC)  F33.1     2. Generalized anxiety disorder  F41.1      Plan: Patient's goal of treatment is to hopefully to come out and have a different outlook and not be in a hamster ball and would like to look at things with the glass half-full versus half-empty and to also learn coping skills that work. Patient voices she would also like to do some trauma work / healing    Treatment Target: Understand the relationship between thoughts, emotions, and behaviors  Psychoeducation on CBTs model   Oriented the client to the therapeutic approach Teach the connection between thoughts, emotions, and behaviors    Treatment Target: Increase realistic balanced thinking -to learn how to replace thinking with thoughts that are more accurate or helpful Explore patient's thoughts, beliefs, automatic thoughts, assumptions  Identify and replace unhelpful thinking patterns (upsetting ideas, self-talk and mental images) Process trauma and distress and allow for emotional release and identification of "stuck points'  Questioning and challenging thoughts Cognitive reappraisal  Restructuring, Socratic questioning  Teach patient about core beliefs    Treatment Target: Increase sense of self - "who am I"  / Creating a life worth living  Values clarification  Self-care - nutrition, sleep, exercise  Mindfulness skills Emotional regulation skills  Interpersonal Effectiveness  Distress Tolerance Assist patient in developing goal and value directions   Future Appointments  Date Time Provider Department Center  09/02/2023 10:00 AM Kathreen Cosier, LCSW [facility]  None      Kathreen Cosier, LCSW

## 2023-09-02 ENCOUNTER — Ambulatory Visit: Payer: Medicaid Other | Admitting: Licensed Clinical Social Worker

## 2023-09-10 ENCOUNTER — Ambulatory Visit: Payer: Medicaid Other | Admitting: Licensed Clinical Social Worker

## 2023-09-10 DIAGNOSIS — F411 Generalized anxiety disorder: Secondary | ICD-10-CM

## 2023-09-10 DIAGNOSIS — F331 Major depressive disorder, recurrent, moderate: Secondary | ICD-10-CM

## 2023-09-10 DIAGNOSIS — F4310 Post-traumatic stress disorder, unspecified: Secondary | ICD-10-CM | POA: Insufficient documentation

## 2023-09-10 NOTE — Progress Notes (Signed)
Counselor/Therapist Progress Note  Patient ID: Ruth Robinson, MRN: [id number],    Date: 09/10/2023  Time Spent: 39 minutes    Treatment Type: Psychotherapy  Reported Symptoms:  mood "fairly good" intermittent sadness, low mood, ruminating thoughts   Mental Status Exam:  Appearance:   Casual, Neat, and Well Groomed     Behavior:  Appropriate, Sharing, and Motivated  Motor:  Normal  Speech/Language:   Clear and Coherent and Normal Rate  Affect:  Appropriate, Congruent, and Full Range  Mood:  normal  Thought process:  normal  Thought content:    WNL  Sensory/Perceptual disturbances:    WNL  Orientation:  oriented to person, place, time/date, situation, and day of week  Attention:  Good  Concentration:  Good  Memory:  WNL  Fund of knowledge:   Good  Insight:    Fair  Judgment:   Fair  Impulse Control:  Fair   Risk Assessment: Danger to Self:  No Self-injurious Behavior: No Danger to Others: No Duty to Warn:no Physical Aggression / Violence:No  Access to Firearms a concern: No  Gang Involvement:No   Subjective: Patient was receptive to feedback and intervention from LCSW and actively and effectively participated throughout the session. Patient reports that her mood and emotions have been up and down due to recent breakup with boyfriend/father of baby. She reports that is actively noticing ruminating thoughts and refocusing. Patient reports continued motivation for treatment. She reports benefit from added medication- sleep improvement, some drowsiness when she wakes.   Interventions: Cognitive Behavioral Therapy and Client Centered  Established psychological safety. Checked in with patient regarding their week. LCSW processed with the patient how they have been doing since the last follow-up session. LCSW assisted patient in processing their thoughts and emotions regarding challenges in relationship with ex-boyfriend/father of baby. LCSW reviewed coping strategies,  including noticing, resetting, mindfulness strategies, and radical acceptance. Encouraged patient to continue to engage in positive activities with her mom. Provided support through active listening, validation of feelings, and highlighted patient's strengths.   Diagnosis:   ICD-10-CM   1. Major depressive disorder, recurrent episode, moderate (HCC)  F33.1     2. Generalized anxiety disorder  F41.1     3. PTSD (post-traumatic stress disorder)  F43.10    recent dx. from [facility] mood disorder clinic.     Plan: Patient's goal of treatment is to hopefully to come out and have a different outlook and not be in a hamster ball and would like to look at things with the glass half-full versus half-empty and to also learn coping skills that work. Patient voices she would also like to do some trauma work / healing    Treatment Target: Understand the relationship between thoughts, emotions, and behaviors  Psychoeducation on CBTs model   Oriented the client to the therapeutic approach Teach the connection between thoughts, emotions, and behaviors    Treatment Target: Increase realistic balanced thinking -to learn how to replace thinking with thoughts that are more accurate or helpful Explore patient's thoughts, beliefs, automatic thoughts, assumptions  Identify and replace unhelpful thinking patterns (upsetting ideas, self-talk and mental images) Process trauma and distress and allow for emotional release and identification of "stuck points'  Questioning and challenging thoughts Cognitive reappraisal  Restructuring, Socratic questioning  Teach patient about core beliefs    Treatment Target: Increase sense of self - "who am I"  / Creating a life worth living  Values clarification  Self-care - nutrition, sleep, exercise  Mindfulness skills Emotional  regulation skills  Interpersonal Effectiveness  Distress Tolerance Assist patient in developing goal and value directions   Future Appointments  Date  Time Provider Department Center  09/24/2023 10:00 AM Ruth Cosier, LCSW [facility] None     Ruth Robinson, [place]

## 2023-09-24 ENCOUNTER — Ambulatory Visit: Payer: Medicaid Other | Admitting: Licensed Clinical Social Worker

## 2023-09-24 DIAGNOSIS — F331 Major depressive disorder, recurrent, moderate: Secondary | ICD-10-CM

## 2023-09-24 DIAGNOSIS — F4310 Post-traumatic stress disorder, unspecified: Secondary | ICD-10-CM

## 2023-09-24 DIAGNOSIS — F411 Generalized anxiety disorder: Secondary | ICD-10-CM

## 2023-09-24 NOTE — Progress Notes (Signed)
Counselor/Therapist Progress Note  Patient ID: Ruth Robinson, MRN: [id number],    Date: 09/24/2023  Time Spent: 45 minutes   Treatment Type: Psychotherapy  Reported Symptoms:  mood improvement   Mental Status Exam:  Appearance:   Neat and Well Groomed     Behavior:  Appropriate, Sharing, and Motivated  Motor:  Normal  Speech/Language:   Clear and Coherent and Normal Rate  Affect:  Appropriate, Congruent, and Full Range  Mood:  normal  Thought process:  normal  Thought content:    WNL  Sensory/Perceptual disturbances:    WNL  Orientation:  oriented to person, place, time/date, and situation  Attention:  Good  Concentration:  Good  Memory:  WNL  Fund of knowledge:   Good  Insight:    Fair  Judgment:   Fair  Impulse Control:  Fair   Risk Assessment: Danger to Self:  No Self-injurious Behavior: No Danger to Others: No Duty to Warn:no Physical Aggression / Violence:No  Access to Firearms a concern: No  Gang Involvement:No   Subjective: Patient was engaged and cooperative throughout the session using time effectively to discuss thoughts and feelings related to progress and stressors. Patient reports her mood has been good, she reports the combo of Lexapro & Remeron is beneficial. She also shares other people have noticed the mood improvement.  Patient was receptive to feedback and intervention from LCSW.     Interventions: Cognitive Behavioral Therapy and Client Centered  Established psychological safety. Checked in with patient regarding current symptoms and psychosocial stressors. LCSW processed with the patient how they have been doing since the last follow-up session. LCSW and patient discussed coping skills that she has benefited from, reviewed gratitude practices, mindfulness practices, and encouraged continued engagement in positive relationships. Provided resource info. On diaper bank. Provided support through active listening, validation of feelings, and highlighted  patient's strengths.  Diagnosis:   ICD-10-CM   1. Major depressive disorder, recurrent episode, moderate (HCC)  F33.1     2. Generalized anxiety disorder  F41.1     3. PTSD (post-traumatic stress disorder)  F43.10      Plan: Patient's goal of treatment is to hopefully to come out and have a different outlook and not be in a hamster ball and would like to look at things with the glass half-full versus half-empty and to also learn coping skills that work. Patient voices she would also like to do some trauma work / healing    Treatment Target: Understand the relationship between thoughts, emotions, and behaviors  Psychoeducation on CBTs model   Oriented the client to the therapeutic approach Teach the connection between thoughts, emotions, and behaviors    Treatment Target: Increase realistic balanced thinking -to learn how to replace thinking with thoughts that are more accurate or helpful Explore patient's thoughts, beliefs, automatic thoughts, assumptions  Identify and replace unhelpful thinking patterns (upsetting ideas, self-talk and mental images) Process trauma and distress and allow for emotional release and identification of "stuck points'  Questioning and challenging thoughts Cognitive reappraisal  Restructuring, Socratic questioning  Teach patient about core beliefs    Treatment Target: Increase sense of self - "who am I"  / Creating a life worth living  Values clarification  Self-care - nutrition, sleep, exercise  Mindfulness skills Emotional regulation skills  Interpersonal Effectiveness  Distress Tolerance Assist patient in developing goal and value directions   Future Appointments  Date Time Provider Department Center  10/08/2023 10:00 AM Kathreen Cosier, LCSW [facility] None  Kathreen Cosier, LCSW

## 2023-10-08 ENCOUNTER — Ambulatory Visit: Payer: Medicaid Other | Admitting: Licensed Clinical Social Worker

## 2023-10-15 ENCOUNTER — Ambulatory Visit: Payer: Medicaid Other | Admitting: Licensed Clinical Social Worker

## 2023-10-15 DIAGNOSIS — F4310 Post-traumatic stress disorder, unspecified: Secondary | ICD-10-CM

## 2023-10-15 DIAGNOSIS — F411 Generalized anxiety disorder: Secondary | ICD-10-CM

## 2023-10-15 DIAGNOSIS — F331 Major depressive disorder, recurrent, moderate: Secondary | ICD-10-CM

## 2023-10-15 NOTE — Progress Notes (Signed)
Counselor/Therapist Progress Note  Patient ID: Ruth Robinson, MRN: [id number],    Date: 10/15/2023  Time Spent:  45 minutes   Treatment Type: Psychotherapy  Reported Symptoms:  Stress, anxiety, anxious thoughts, low mood   Mental Status Exam:  Appearance:   Casual, Neat, and Well Groomed     Behavior:  Appropriate and Motivated  Motor:  Normal  Speech/Language:   Clear and Coherent and Normal Rate  Affect:  Appropriate, Congruent, and Full Range  Mood:  normal  Thought process:  normal  Thought content:    WNL  Sensory/Perceptual disturbances:    WNL  Orientation:  oriented to person, place, time/date, situation, and day of week  Attention:  Good  Concentration:  Good  Memory:  WNL  Fund of knowledge:   Good  Insight:    Fair  Judgment:   Fair  Impulse Control:  Fair   Risk Assessment: Danger to Self:  No Self-injurious Behavior: No Danger to Others: No Duty to Warn:no Physical Aggression / Violence:No  Access to Firearms a concern: No  Gang Involvement:No   Subjective: Patient was receptive to feedback and intervention from LCSW and actively and effectively participated throughout the session discussing thoughts, feelings, and parenting tips.  Patient describes having a difficult morning due to parenting challenges with oldest child. Patient reports benefit from today's session.    Interventions: Cognitive Behavioral Therapy and Client Centered, Triple P  Established psychological safety. Checked in with patient regarding their week, current symptoms and psychosocial stressors. LCSW assisted patient in processing their thoughts and emotions about what they have experienced with parenting challenges. LCSW validated patient's feelings of distress and joined with patient through reflective listening and compassionate presence. LCSW checked in with patient about readiness to receive parent training, providing information about Triple P and offering positive  support/praise and TP tips regarding consequences, logical consequences, planned ignoring, and clear calm instructions. Encouraged patient to reset for the day through love and kindness to self and reminding herself that she has a plan.    Diagnosis:   ICD-10-CM   1. Major depressive disorder, recurrent episode, moderate (HCC)  F33.1     2. Generalized anxiety disorder  F41.1     3. PTSD (post-traumatic stress disorder)  F43.10      Plan: Patient's goal of treatment is to hopefully to come out and have a different outlook and not be in a hamster ball and would like to look at things with the glass half-full versus half-empty and to also learn coping skills that work. Patient voices she would also like to do some trauma work / healing    Treatment Target: Understand the relationship between thoughts, emotions, and behaviors  Psychoeducation on CBTs model   Oriented the client to the therapeutic approach Teach the connection between thoughts, emotions, and behaviors    Treatment Target: Increase realistic balanced thinking -to learn how to replace thinking with thoughts that are more accurate or helpful Explore patient's thoughts, beliefs, automatic thoughts, assumptions  Identify and replace unhelpful thinking patterns (upsetting ideas, self-talk and mental images) Process trauma and distress and allow for emotional release and identification of "stuck points'  Questioning and challenging thoughts Cognitive reappraisal  Restructuring, Socratic questioning  Teach patient about core beliefs    Treatment Target: Increase sense of self - "who am I"  / Creating a life worth living  Values clarification  Self-care - nutrition, sleep, exercise  Mindfulness skills Emotional regulation skills  Interpersonal Effectiveness  Distress Tolerance  Assist patient in developing goal and value directions   Future Appointments  Date Time Provider Department Center  10/29/2023  9:00 AM Kathreen Cosier, LCSW [facility] None     Kathreen Cosier, [place]

## 2023-10-29 ENCOUNTER — Ambulatory Visit: Payer: Medicaid Other | Admitting: Licensed Clinical Social Worker

## 2023-11-05 ENCOUNTER — Ambulatory Visit: Payer: Medicaid Other | Admitting: Licensed Clinical Social Worker

## 2023-11-05 DIAGNOSIS — F331 Major depressive disorder, recurrent, moderate: Secondary | ICD-10-CM

## 2023-11-05 DIAGNOSIS — F411 Generalized anxiety disorder: Secondary | ICD-10-CM

## 2023-11-05 DIAGNOSIS — F4310 Post-traumatic stress disorder, unspecified: Secondary | ICD-10-CM

## 2023-11-05 NOTE — Progress Notes (Signed)
Counselor/Therapist Progress Note  Patient ID: THONDA COTTRILL, MRN: [id number],    Date: 11/05/2023  Time Spent: 50 minutes    Treatment Type: Psychotherapy  Reported Symptoms:  Intermittent depressed mood, irritability, anxiety, anxious thoughts   Mental Status Exam:  Appearance:   Casual and Neat     Behavior:  Appropriate, Sharing, and Motivated  Motor:  Normal  Speech/Language:   Clear and Coherent and Normal Rate  Affect:  Appropriate, Congruent, and Full Range  Mood:  normal  Thought process:  normal  Thought content:    WNL  Sensory/Perceptual disturbances:    WNL  Orientation:  oriented to person, place, time/date, situation, and day of week  Attention:  Good  Concentration:  Good  Memory:  WNL  Fund of knowledge:   Good  Insight:    Fair  Judgment:   Fair  Impulse Control:  Fair   Risk Assessment: Danger to Self:  No Self-injurious Behavior: No Danger to Others: No Duty to Warn:no Physical Aggression / Violence:No  Access to Firearms a concern: No  Gang Involvement:No   Subjective: Patient was engaged and cooperative throughout the session using time effectively to discuss thoughts and feelings. Patient reports multiple stressors over the past two weeks, including co-parenting, political climate, and with her family that she is living with. Patient voices continued motivation for treatment.    Interventions: Cognitive Behavioral Therapy and Client Centered  LCSW established psychological safety. LCSW met with patient to identify needs related to stressors and functioning, and assess and monitor for signs and symptoms of PTSD/depression/anxiety, and assess safety. Checked in with patient regarding how they have been doing since the last follow-up session. Provided supportive space for patient to verbally ventilate about her multiple stressors, LCSW intervened with positive regard and optimism to validate client's emotions, and supported client in exploring ways  to increase her intentional use of self care and boundary setting.  Diagnosis:   ICD-10-CM   1. Major depressive disorder, recurrent episode, moderate (HCC)  F33.1     2. Generalized anxiety disorder  F41.1     3. PTSD (post-traumatic stress disorder)  F43.10      Plan: Patient's goal of treatment is to hopefully to come out and have a different outlook and not be in a hamster ball and would like to look at things with the glass half-full versus half-empty and to also learn coping skills that work. Patient voices she would also like to do some trauma work / healing    Treatment Target: Understand the relationship between thoughts, emotions, and behaviors  Psychoeducation on CBTs model   Oriented the client to the therapeutic approach Teach the connection between thoughts, emotions, and behaviors    Treatment Target: Increase realistic balanced thinking -to learn how to replace thinking with thoughts that are more accurate or helpful Explore patient's thoughts, beliefs, automatic thoughts, assumptions  Identify and replace unhelpful thinking patterns (upsetting ideas, self-talk and mental images) Process trauma and distress and allow for emotional release and identification of "stuck points'  Questioning and challenging thoughts Cognitive reappraisal  Restructuring, Socratic questioning  Teach patient about core beliefs    Treatment Target: Increase sense of self - "who am I"  / Creating a life worth living  Values clarification  Self-care - nutrition, sleep, exercise  Mindfulness skills Emotional regulation skills  Interpersonal Effectiveness  Distress Tolerance Assist patient in developing goal and value directions   Future Appointments  Date Time Provider Department Center  11/18/2023 10:00  AM Kathreen Cosier, LCSW [facility] None     Kathreen Cosier, [place]

## 2023-11-18 ENCOUNTER — Ambulatory Visit: Payer: Medicaid Other | Admitting: Licensed Clinical Social Worker

## 2023-11-18 DIAGNOSIS — F411 Generalized anxiety disorder: Secondary | ICD-10-CM

## 2023-11-18 DIAGNOSIS — F4310 Post-traumatic stress disorder, unspecified: Secondary | ICD-10-CM

## 2023-11-18 DIAGNOSIS — F331 Major depressive disorder, recurrent, moderate: Secondary | ICD-10-CM

## 2023-11-18 NOTE — Progress Notes (Addendum)
Counselor/Therapist Progress Note  Patient ID: Ruth Robinson, MRN: [id number],    Date: 11/18/2023  Time Spent: 45 minutes    Treatment Type: Psychotherapy  Reported Symptoms:  Mood "up and down"  Mental Status Exam:  Appearance:   Casual and Neat     Behavior:  Appropriate, Sharing, and Motivated  Motor:  Normal  Speech/Language:   Clear and Coherent and Normal Rate  Affect:  Appropriate, Congruent, and Full Range  Mood:  normal  Thought process:  normal  Thought content:    WNL  Sensory/Perceptual disturbances:    WNL  Orientation:  oriented to person, place, time/date, situation, and day of week  Attention:  Good  Concentration:  Good  Memory:  WNL  Fund of knowledge:   Good  Insight:    Good  Judgment:   Good  Impulse Control:  Good   Risk Assessment: Danger to Self:  No Self-injurious Behavior: No Danger to Others: No Duty to Warn:no Physical Aggression / Violence:No  Access to Firearms a concern: No  Gang Involvement:No   Subjective: Patient reports her mood has been up and down due to challenges within the house hold. Patient was receptive to feedback and intervention from LCSW and actively and effectively participated throughout the session. Patient reports continued motivation for treatment.    Interventions: Cognitive Behavioral Therapy and Client Centered  LCSW established psychological safety. LCSW met with patient to identify needs related to stressors and functioning, and assess and monitor signs and symptoms, and assess safety. Checked in with patient regarding how they have been doing since the last follow-up session. LCSW assisted patient in processing their thoughts and emotions regarding family relationship challenges and challenges with father of her children. LCSW discussed possible resources for housing relocation through [facility] due to feeling uncomfortable with her uncle that lives in the home (no physical altercations have been  reported by patient). Assisted patient in clarifying goals and problem solved barriers to these goals and reviewed mindfulness meditation and self-care. Provided support through active listening, validation of feelings, and highlighted patient's strengths.  Diagnosis:   ICD-10-CM   1. Major depressive disorder, recurrent episode, moderate (HCC)  F33.1     2. Generalized anxiety disorder  F41.1     3. PTSD (post-traumatic stress disorder)  F43.10      Plan: Patient's goal of treatment is to hopefully to come out and have a different outlook and not be in a hamster ball and would like to look at things with the glass half-full versus half-empty and to also learn coping skills that work. Patient voices she would also like to do some trauma work / healing    Treatment Target: Understand the relationship between thoughts, emotions, and behaviors  Psychoeducation on CBTs model   Oriented the client to the therapeutic approach Teach the connection between thoughts, emotions, and behaviors    Treatment Target: Increase realistic balanced thinking -to learn how to replace thinking with thoughts that are more accurate or helpful Explore patient's thoughts, beliefs, automatic thoughts, assumptions  Identify and replace unhelpful thinking patterns (upsetting ideas, self-talk and mental images) Process trauma and distress and allow for emotional release and identification of "stuck points'  Questioning and challenging thoughts Cognitive reappraisal  Restructuring, Socratic questioning  Teach patient about core beliefs    Treatment Target: Increase sense of self - "who am I"  / Creating a life worth living  Values clarification  Self-care - nutrition, sleep, exercise  Mindfulness skills Emotional  regulation skills  Interpersonal Effectiveness  Distress Tolerance Assist patient in developing goal and value directions   Future Appointments  Date Time Provider Department Center  12/02/2023 10:30  AM Kathreen Cosier, LCSW [facility] None     Kathreen Cosier, LCSW

## 2023-12-02 ENCOUNTER — Ambulatory Visit: Payer: Medicaid Other | Admitting: Licensed Clinical Social Worker
